# Patient Record
Sex: Male | Born: 1935 | Race: White | Hispanic: No | Marital: Married | Smoking: Current some day smoker
Health system: Southern US, Community
[De-identification: ages and names within clinical notes are randomized; demographics above are authoritative.]

## PROBLEM LIST (undated history)

## (undated) DIAGNOSIS — Z860100 Personal history of colon polyps, unspecified: Secondary | ICD-10-CM

## (undated) DIAGNOSIS — IMO0001 Reserved for inherently not codable concepts without codable children: Secondary | ICD-10-CM

## (undated) DIAGNOSIS — M109 Gout, unspecified: Secondary | ICD-10-CM

## (undated) DIAGNOSIS — R079 Chest pain, unspecified: Secondary | ICD-10-CM

## (undated) DIAGNOSIS — R413 Other amnesia: Secondary | ICD-10-CM

## (undated) DIAGNOSIS — I951 Orthostatic hypotension: Secondary | ICD-10-CM

## (undated) DIAGNOSIS — N400 Enlarged prostate without lower urinary tract symptoms: Secondary | ICD-10-CM

## (undated) DIAGNOSIS — E785 Hyperlipidemia, unspecified: Secondary | ICD-10-CM

## (undated) DIAGNOSIS — G4730 Sleep apnea, unspecified: Secondary | ICD-10-CM

## (undated) DIAGNOSIS — R42 Dizziness and giddiness: Secondary | ICD-10-CM

## (undated) DIAGNOSIS — K635 Polyp of colon: Secondary | ICD-10-CM

## (undated) DIAGNOSIS — E7800 Pure hypercholesterolemia, unspecified: Secondary | ICD-10-CM

## (undated) DIAGNOSIS — R972 Elevated prostate specific antigen [PSA]: Secondary | ICD-10-CM

## (undated) HISTORY — DX: Gout, unspecified: M10.9

## (undated) HISTORY — PX: CARDIAC CATHETERIZATION: SHX172

## (undated) HISTORY — DX: Reserved for inherently not codable concepts without codable children: IMO0001

## (undated) HISTORY — DX: Polyp of colon: K63.5

## (undated) HISTORY — DX: Personal history of colonic polyps: Z86.010

## (undated) HISTORY — DX: Pure hypercholesterolemia, unspecified: E78.00

## (undated) HISTORY — PX: CATARACT EXTRACTION, BILATERAL: SHX1313

## (undated) HISTORY — DX: Personal history of colon polyps, unspecified: Z86.0100

## (undated) HISTORY — PX: COLONOSCOPY: SHX174

## (undated) HISTORY — DX: Dizziness and giddiness: R42

## (undated) HISTORY — DX: Hyperlipidemia, unspecified: E78.5

## (undated) HISTORY — DX: Elevated prostate specific antigen (PSA): R97.20

## (undated) HISTORY — DX: Benign prostatic hyperplasia without lower urinary tract symptoms: N40.0

## (undated) HISTORY — DX: Chest pain, unspecified: R07.9

## (undated) HISTORY — PX: TONSILLECTOMY: SHX5217

---

## 1999-07-19 ENCOUNTER — Ambulatory Visit (HOSPITAL_COMMUNITY): Admission: RE | Admit: 1999-07-19 | Discharge: 1999-07-19 | Payer: Self-pay | Admitting: Family Medicine

## 1999-07-19 ENCOUNTER — Encounter: Payer: Self-pay | Admitting: Family Medicine

## 1999-08-21 ENCOUNTER — Ambulatory Visit (HOSPITAL_COMMUNITY): Admission: RE | Admit: 1999-08-21 | Discharge: 1999-08-21 | Payer: Self-pay | Admitting: Cardiology

## 1999-11-12 ENCOUNTER — Ambulatory Visit (HOSPITAL_BASED_OUTPATIENT_CLINIC_OR_DEPARTMENT_OTHER): Admission: RE | Admit: 1999-11-12 | Discharge: 1999-11-12 | Payer: Self-pay | Admitting: *Deleted

## 2009-03-30 ENCOUNTER — Observation Stay (HOSPITAL_COMMUNITY): Admission: EM | Admit: 2009-03-30 | Discharge: 2009-03-31 | Payer: Self-pay | Admitting: Emergency Medicine

## 2010-07-23 LAB — COMPREHENSIVE METABOLIC PANEL
ALT: 17 U/L (ref 0–53)
AST: 22 U/L (ref 0–37)
Albumin: 4.2 g/dL (ref 3.5–5.2)
Alkaline Phosphatase: 51 U/L (ref 39–117)
BUN: 16 mg/dL (ref 6–23)
CO2: 28 mEq/L (ref 19–32)
Calcium: 9.5 mg/dL (ref 8.4–10.5)
Chloride: 103 mEq/L (ref 96–112)
Creatinine, Ser: 0.89 mg/dL (ref 0.4–1.5)
GFR calc Af Amer: >60 mL/min (ref >60)
GFR calc non Af Amer: >60 mL/min (ref >60)
Glucose, Bld: 90 mg/dL (ref 70–99)
Potassium: 4.3 mEq/L (ref 3.5–5.1)
Sodium: 137 mEq/L (ref 135–145)
Total Bilirubin: 0.8 mg/dL (ref 0.3–1.2)
Total Protein: 7.1 g/dL (ref 6.0–8.3)

## 2010-07-23 LAB — CARDIAC PANEL(CRET KIN+CKTOT+MB+TROPI)
Relative Index: INVALID (ref 0.0–2.5)
Total CK: 65 U/L (ref 7–232)
Troponin I: <0.01 ng/mL (ref 0.00–0.06)

## 2010-07-23 LAB — POCT CARDIAC MARKERS
CKMB, poc: 1.7 ng/mL (ref 1.0–8.0)
CKMB, poc: 2.6 ng/mL (ref 1.0–8.0)
Myoglobin, poc: 87.9 ng/mL (ref 12–200)
Troponin i, poc: <0.05 ng/mL (ref 0.00–0.09)

## 2010-07-23 LAB — DIFFERENTIAL
Basophils Relative: 0 % (ref 0–1)
Lymphocytes Relative: 26 % (ref 12–46)
Monocytes Absolute: 0.6 10*3/uL (ref 0.1–1.0)
Monocytes Relative: 8 % (ref 3–12)
Neutro Abs: 5.2 10*3/uL (ref 1.7–7.7)

## 2010-07-23 LAB — LIPID PANEL
HDL: 70 mg/dL (ref >39)
Triglycerides: 56 mg/dL (ref <150)
VLDL: 11 mg/dL (ref 0–40)

## 2010-07-23 LAB — CBC
HCT: 47.7 % (ref 39.0–52.0)
Hemoglobin: 16.1 g/dL (ref 13.0–17.0)
MCHC: 33.8 g/dL (ref 30.0–36.0)
MCV: 94.3 fL (ref 78.0–100.0)
Platelets: 209 10*3/uL (ref 150–400)
RBC: 5.05 MIL/uL (ref 4.22–5.81)
RDW: 13.8 % (ref 11.5–15.5)
WBC: 8.1 10*3/uL (ref 4.0–10.5)

## 2010-07-23 LAB — POCT I-STAT, CHEM 8
BUN: 19 mg/dL (ref 6–23)
Chloride: 105 mEq/L (ref 96–112)
Creatinine, Ser: 0.8 mg/dL (ref 0.4–1.5)
Glucose, Bld: 92 mg/dL (ref 70–99)
Potassium: 4.5 mEq/L (ref 3.5–5.1)

## 2010-07-23 LAB — TROPONIN I: Troponin I: 0.02 ng/mL (ref 0.00–0.06)

## 2010-09-06 NOTE — Cardiovascular Report (Signed)
[place]. [facility]  Patient:    Ronald Blevins, Ronald Blevins                       MRN: [id number] Proc. Date: 08/21/99 Adm. Date:  [id number] Disc. Date: [id number] Attending:  Corliss Marcus CC:         Lum Babe, M.D.             Cardiac Catheterization Laboratory                        Cardiac Catheterization  CINE NUMBER:  [id number]  PROCEDURES PERFORMED: 1. Left heart catheterization. 2. Coronary angiography. 3. Left ventriculogram. 4. Percutaneous closure, right femoral artery.  INDICATIONS:  Ronald Blevins is a 77 year old man, who has experienced chest discomfort and lightheadedness with syncope.  A myocardial perfusion study suggested infralateral ischemia.  This study is undertaken to evaluate for possible CAD as an etiology.  DESCRIPTION OF PROCEDURE:  The patient was brought to the cardiac catheterization laboratory in the postabsorptive state.  The right groin was prepped and draped in the usual sterile fashion.  Local anesthesia was obtained with the infiltration of 1% lidocaine.  A 5 French catheter sheath was inserted percutaneously into the right femoral artery utilizing an anterior approach over a guiding J wire.  A 5 French 110 cm pigtail catheter was advanced to the ascending aorta where the pressure was recorded.  The catheter was then prolapsed across the aortic valve and the pressure again recorded both prior to and following the ventriculogram.  A 30 degree RAO cine left ventriculogram was performed utilizing a power injector.  Then, 42 cc of Omnipaque were injected at 13 cc/sec.  The pigtail catheter was then exchanged for a 5 French #4 left Judkins catheter.  Cineangiography of the left coronary artery was conducted in multiple LAO and RAO projections.  A 5 French #4 left Judkins catheter for cineangiography.  The right coronary artery was conducted in multiple LAO and RAO projections.  The catheter was then removed.  A digital  cineangiography of the right femoral artery was performed utilizing hand injection in a 45 degree RAO angulation. It documented the puncture site in the right femoral artery to be at or just above the bifurcation into the profunda femoral and superficial femoral arteries.  We then proceeded with percutaneous closure using the Perclose system.  Good hemostasis was achieved as well as intact distal pulses.  FLUOROSCOPY TIME:  4.1 minutes.  TOTAL CONTRAST UTILIZED:  Omnipaque 120 cc.  HEMODYNAMICS:  Systemic arterial pressure was 132/80 with a mean of 105 mmHg. There was no systolic gradient across the aortic valve.  Left ventricular end-diastolic pressure was 15 mmHg pre and post ventriculogram.  ANGIOGRAPHY:  LEFT VENTRICULOGRAM:  The left ventriculogram demonstrated normal chamber size and normal global systolic function.  The calculated ejection fraction utilizing a single plane cine method was 69%.  There was no mitral regurgitation.  There was no coronary calcification seen.  There was a right dominant coronary system present.  The main left coronary artery was normal.  The left anterior descending artery and its branches were essentially normal. There were some luminal irregularities throughout the proximal and midportion of the LAD.  No significant obstructions were seen.  Two small to moderate sized diagonal branches arose without significant obstruction.  The left circumflex artery and its branches were normal.  There were three marginal branches which arose, none of which had  any significant obstructions. There were no stenoses within the main trunk or left circumflex either.  The right coronary artery and its branches were normal.  This is a relatively small vessel that did give rise to a small posterior descending and posterolateral branch and segment.  There were no significant obstructions. There were luminal irregularities in the proximal and midportion.  There  were no collateral vessels seen.  FINAL IMPRESSION: 1. Normal left ventricular size and systolic function. 2. No significant coronary artery disease seen.  PLAN:  The patient will be referred to an electrophysiologist for further evaluation of his syncopal episode. DD:  08/21/99 TD:  08/22/99 Job: [id number]

## 2014-12-30 ENCOUNTER — Emergency Department (HOSPITAL_COMMUNITY): Payer: Medicare Other

## 2014-12-30 ENCOUNTER — Encounter (HOSPITAL_COMMUNITY): Payer: Self-pay | Admitting: *Deleted

## 2014-12-30 ENCOUNTER — Emergency Department (HOSPITAL_COMMUNITY)
Admission: EM | Admit: 2014-12-30 | Discharge: 2014-12-31 | Disposition: A | Discharge status: Home / Self Care | Payer: Medicare Other | Attending: Emergency Medicine | Admitting: Emergency Medicine

## 2014-12-30 DIAGNOSIS — R42 Dizziness and giddiness: Secondary | ICD-10-CM | POA: Diagnosis not present

## 2014-12-30 LAB — COMPREHENSIVE METABOLIC PANEL
ALBUMIN: 3.8 g/dL (ref 3.5–5.0)
ALK PHOS: 43 U/L (ref 38–126)
ALT: 7 U/L — ABNORMAL LOW (ref 17–63)
AST: 16 U/L (ref 15–41)
Anion gap: 12 (ref 5–15)
BILIRUBIN TOTAL: 0.7 mg/dL (ref 0.3–1.2)
BUN: 23 mg/dL — AB (ref 6–20)
CALCIUM: 8.8 mg/dL — AB (ref 8.9–10.3)
CO2: 23 mmol/L (ref 22–32)
Chloride: 102 mmol/L (ref 101–111)
Creatinine, Ser: 1.03 mg/dL (ref 0.61–1.24)
GFR calc Af Amer: >60 mL/min (ref >60)
GLUCOSE: 111 mg/dL — AB (ref 65–99)
POTASSIUM: 3.6 mmol/L (ref 3.5–5.1)
Sodium: 137 mmol/L (ref 135–145)
TOTAL PROTEIN: 6 g/dL — AB (ref 6.5–8.1)

## 2014-12-30 LAB — I-STAT TROPONIN, ED: TROPONIN I, POC: 0 ng/mL (ref 0.00–0.08)

## 2014-12-30 LAB — DIFFERENTIAL
Basophils Absolute: 0 10*3/uL (ref 0.0–0.1)
Basophils Relative: 0 % (ref 0–1)
EOS ABS: 0.3 10*3/uL (ref 0.0–0.7)
EOS PCT: 3 % (ref 0–5)
LYMPHS ABS: 2.1 10*3/uL (ref 0.7–4.0)
LYMPHS PCT: 25 % (ref 12–46)
MONOS PCT: 7 % (ref 3–12)
Monocytes Absolute: 0.6 10*3/uL (ref 0.1–1.0)
Neutro Abs: 5.3 10*3/uL (ref 1.7–7.7)
Neutrophils Relative %: 65 % (ref 43–77)

## 2014-12-30 LAB — PROTIME-INR
INR: 1.08 (ref 0.00–1.49)
Prothrombin Time: 14.2 seconds (ref 11.6–15.2)

## 2014-12-30 LAB — CBC
HEMATOCRIT: 42.1 % (ref 39.0–52.0)
HEMOGLOBIN: 14.1 g/dL (ref 13.0–17.0)
MCH: 31.1 pg (ref 26.0–34.0)
MCHC: 33.5 g/dL (ref 30.0–36.0)
MCV: 92.9 fL (ref 78.0–100.0)
Platelets: 208 10*3/uL (ref 150–400)
RBC: 4.53 MIL/uL (ref 4.22–5.81)
RDW: 13.2 % (ref 11.5–15.5)
WBC: 8.2 10*3/uL (ref 4.0–10.5)

## 2014-12-30 LAB — APTT: aPTT: 26 seconds (ref 24–37)

## 2014-12-30 NOTE — ED Notes (Signed)
Pt reports approx one hour ago onset of right side facial pain, felt lightheaded, nauseated, diaphoretic and difficulty walking due to unsteady gait. Reports it lasted approx 15 mins and then improved. Pt ambulatory on arrival to ed. No facial droop noted, grips are equal, speech is clear.

## 2014-12-30 NOTE — ED Provider Notes (Signed)
CSN: [id number]     Arrival date & time 12/30/14  1741 History   First MD Initiated Contact with Patient 12/30/14 2232     Chief Complaint  Patient presents with  . Dizziness     Patient is a 81 y.o. male presenting with dizziness. The history is provided by the patient. No language interpreter was used.  Dizziness  Ronald Blevins presents for evaluation of altered facial sensation. At 5 PM this evening he had an abnormal  sensation across the right side of his face that lasted for about 15 minutes. He had associated discomfort that radiated up the right side of his face. He felt dizzy and diaphoretic during this event. He felt generalized weakness. He currently has no complaints and his symptoms have completely resolved. He has no medical problems and is followed by Dr. Harrington Challenger.  History reviewed. No pertinent past medical history. History reviewed. No pertinent past surgical history. History reviewed. No pertinent family history. Social History  Substance Use Topics  . Smoking status: Never Smoker   . Smokeless tobacco: None  . Alcohol Use: Yes     Comment: occ    Review of Systems  Neurological: Positive for dizziness.  All other systems reviewed and are negative.     Allergies  Review of patient's allergies indicates no known allergies.  Home Medications   Prior to Admission medications   Not on File   BP 134/72 mmHg  Pulse 80  Temp(Src) 98.4 F (36.9 C) (Oral)  Resp 20  Ht 5\' 11"  (1.803 m)  Wt 182 lb (82.555 kg)  BMI 25.40 kg/m2  SpO2 98% Physical Exam  Constitutional: He is oriented to person, place, and time. He appears well-developed and well-nourished.  HENT:  Head: Normocephalic and atraumatic.  Cardiovascular: Normal rate and regular rhythm.   No murmur heard. Pulmonary/Chest: Effort normal and breath sounds normal. No respiratory distress.  Abdominal: Soft. There is no tenderness. There is no rebound and no guarding.  Musculoskeletal: He exhibits no edema  or tenderness.  Neurological: He is alert and oriented to person, place, and time. No cranial nerve deficit.  5/5 strength in all four extremities, sensation to light touch intact in all four extremities.  No ataxia.  Skin: Skin is warm and dry.  Psychiatric: He has a normal mood and affect. His behavior is normal.  Nursing note and vitals reviewed.   ED Course  Procedures (including critical care time) Labs Review Labs Reviewed  COMPREHENSIVE METABOLIC PANEL - Abnormal; Notable for the following:    Glucose, Bld 111 (*)    BUN 23 (*)    Calcium 8.8 (*)    Total Protein 6.0 (*)    ALT 7 (*)    All other components within normal limits  PROTIME-INR  APTT  CBC  DIFFERENTIAL  I-STAT TROPOININ, ED    Imaging Review Ct Head Wo Contrast  12/31/2014   CLINICAL DATA:  Dizziness and altered mental status which is now resolved.  EXAM: CT HEAD WITHOUT CONTRAST  TECHNIQUE: Contiguous axial images were obtained from the base of the skull through the vertex without intravenous contrast.  COMPARISON:  None.  FINDINGS: Diffuse cerebral atrophy. Low-attenuation changes in the deep white matter consistent small vessel ischemia. Large CSF space in the right middle cranial fossa probably representing arachnoid cyst. No mass effect or midline shift. Gray-white matter junctions are distinct. Basal cisterns are not effaced. No acute intracranial hemorrhage. Visualized paranasal sinuses and mastoid air cells are not opacified. No  depressed skull fractures.  IMPRESSION: CSF space in the right middle cranial fossa probably representing an arachnoid cyst. No acute intracranial abnormalities. Chronic atrophy and small vessel ischemic changes.   Electronically Signed   By: Lucienne Capers M.D.   On: 12/31/2014 00:28   I have personally reviewed and evaluated these images and lab results as part of my medical decision-making.   EKG Interpretation   Date/Time:  Saturday December 30 2014 18:15:04  EDT Ventricular Rate:  79 PR Interval:  168 QRS Duration: 96 QT Interval:  394 QTC Calculation: 451 R Axis:   -37 Text Interpretation:  Normal sinus rhythm Left axis deviation Minimal  voltage criteria for LVH, may be normal variant Abnormal ECG Confirmed by  Hazle Coca [phone]) on 12/30/2014 10:33:05 PM      MDM   Final diagnoses:  Spell of dizziness    Patient here for evaluation of facial altered sensation, and dizziness prior to ED arrival. Symptoms have completely resolved in the emergency department. He has no focal deficits. History of presentation is not consistent with ACS, dissection, CVA. Discussed with patient possibility of TIA and workup options which include expedited outpatient workup versus admission for workup in the hospital. Patient prefers outpatient follow-up with his primary care provider. Discussed starting baby aspirin daily for stroke prevention. Also discussed close return precautions for recurrent symptoms.    Quintella Reichert, MD 12/31/14 [phone]

## 2014-12-30 NOTE — ED Notes (Signed)
Radiology technician reported that pt. refused CT scan .

## 2014-12-31 MED ORDER — ASPIRIN 81 MG PO CHEW: 81.0000 mg | CHEWABLE_TABLET | Freq: Every day | ORAL | Status: DC

## 2014-12-31 NOTE — Discharge Instructions (Signed)
Transient Ischemic Attack  A transient ischemic attack (TIA) is a "warning stroke" that causes stroke-like symptoms. Unlike a stroke, a TIA does not cause permanent damage to the brain. The symptoms of a TIA can happen very fast and do not last long. It is important to know the symptoms of a TIA and what to do. This can help prevent a major stroke or death.  CAUSES   · A TIA is caused by a temporary blockage in an artery in the brain or neck (carotid artery). The blockage does not allow the brain to get the blood supply it needs and can cause different symptoms. The blockage can be caused by either:  ¨ A blood clot.  ¨ Fatty buildup (plaque) in a neck or brain artery.  RISK FACTORS  · High blood pressure (hypertension).  · High cholesterol.  · Diabetes mellitus.  · Heart disease.  · The build up of plaque in the blood vessels (peripheral artery disease or atherosclerosis).  · The build up of plaque in the blood vessels providing blood and oxygen to the brain (carotid artery stenosis).  · An abnormal heart rhythm (atrial fibrillation).  · Obesity.  · Smoking.  · Taking oral contraceptives (especially in combination with smoking).  · Physical inactivity.  · A diet high in fats, salt (sodium), and calories.  · Alcohol use.  · Use of illegal drugs (especially cocaine and methamphetamine).  · Being male.  · Being African American.  · Being over the age of 55.  · Family history of stroke.  · Previous history of blood clots, stroke, TIA, or heart attack.  · Sickle cell disease.  SYMPTOMS   TIA symptoms are the same as a stroke but are temporary. These symptoms usually develop suddenly, or may be newly present upon awakening from sleep:  · Sudden weakness or numbness of the face, arm, or leg, especially on one side of the body.  · Sudden trouble walking or difficulty moving arms or legs.  · Sudden confusion.  · Sudden personality changes.  · Trouble speaking (aphasia) or understanding.  · Difficulty swallowing.  · Sudden  trouble seeing in one or both eyes.  · Double vision.  · Dizziness.  · Loss of balance or coordination.  · Sudden severe headache with no known cause.  · Trouble reading or writing.  · Loss of bowel or bladder control.  · Loss of consciousness.  DIAGNOSIS   Your caregiver may be able to determine the presence or absence of a TIA based on your symptoms, history, and physical exam. Computed tomography (CT scan) of the brain is usually performed to help identify a TIA. Other tests may be done to diagnose a TIA. These tests may include:  · Electrocardiography.  · Continuous heart monitoring.  · Echocardiography.  · Carotid ultrasonography.  · Magnetic resonance imaging (MRI).  · A scan of the brain circulation.  · Blood tests.  PREVENTION   The risk of a TIA can be decreased by appropriately treating high blood pressure, high cholesterol, diabetes, heart disease, and obesity and by quitting smoking, limiting alcohol, and staying physically active.  TREATMENT   Time is of the essence. Since the symptoms of TIA are the same as a stroke, it is important to seek treatment as soon as possible because you may need a medicine to dissolve the clot (thrombolytic) that cannot be given if too much time has passed. Treatment options vary. Treatment options may include rest, oxygen, intravenous (IV) fluids,   and medicines to thin the blood (anticoagulants). Medicines and diet may be used to address diabetes, high blood pressure, and other risk factors. Measures will be taken to prevent short-term and long-term complications, including infection from breathing foreign material into the lungs (aspiration pneumonia), blood clots in the legs, and falls. Treatment options include procedures to either remove plaque in the carotid arteries or dilate carotid arteries that have narrowed due to plaque. Those procedures are:  · Carotid endarterectomy.  · Carotid angioplasty and stenting.  HOME CARE INSTRUCTIONS   · Take all medicines prescribed  by your caregiver. Follow the directions carefully. Medicines may be used to control risk factors for a stroke. Be sure you understand all your medicine instructions.  · You may be told to take aspirin or the anticoagulant warfarin. Warfarin needs to be taken exactly as instructed.  ¨ Taking too much or too little warfarin is dangerous. Too much warfarin increases the risk of bleeding. Too little warfarin continues to allow the risk for blood clots. While taking warfarin, you will need to have regular blood tests to measure your blood clotting time. A PT blood test measures how long it takes for blood to clot. Your PT is used to calculate another value called an INR. Your PT and INR help your caregiver to adjust your dose of warfarin. The dose can change for many reasons. It is critically important that you take warfarin exactly as prescribed.  ¨ Many foods, especially foods high in vitamin K can interfere with warfarin and affect the PT and INR. Foods high in vitamin K include spinach, kale, broccoli, cabbage, collard and turnip greens, brussels sprouts, peas, cauliflower, seaweed, and parsley as well as beef and pork liver, green tea, and soybean oil. You should eat a consistent amount of foods high in vitamin K. Avoid major changes in your diet, or notify your caregiver before changing your diet. Arrange a visit with a dietitian to answer your questions.  ¨ Many medicines can interfere with warfarin and affect the PT and INR. You must tell your caregiver about any and all medicines you take, this includes all vitamins and supplements. Be especially cautious with aspirin and anti-inflammatory medicines. Do not take or discontinue any prescribed or over-the-counter medicine except on the advice of your caregiver or pharmacist.  ¨ Warfarin can have side effects, such as excessive bruising or bleeding. You will need to hold pressure over cuts for longer than usual. Your caregiver or pharmacist will discuss other  potential side effects.  ¨ Avoid sports or activities that may cause injury or bleeding.  ¨ Be mindful when shaving, flossing your teeth, or handling sharp objects.  ¨ Alcohol can change the body's ability to handle warfarin. It is best to avoid alcoholic drinks or consume only very small amounts while taking warfarin. Notify your caregiver if you change your alcohol intake.  ¨ Notify your dentist or other caregivers before procedures.  · Eat a diet that includes 5 or more servings of fruits and vegetables each day. This may reduce the risk of stroke. Certain diets may be prescribed to address high blood pressure, high cholesterol, diabetes, or obesity.  ¨ A low-sodium, low-saturated fat, low-trans fat, low-cholesterol diet is recommended to manage high blood pressure.  ¨ A low-saturated fat, low-trans fat, low-cholesterol, and high-fiber diet may control cholesterol levels.  ¨ A controlled-carbohydrate, controlled-sugar diet is recommended to manage diabetes.  ¨ A reduced-calorie, low-sodium, low-saturated fat, low-trans fat, low-cholesterol diet is recommended to manage obesity.  ·   Maintain a healthy weight.  · Stay physically active. It is recommended that you get at least 30 minutes of activity on most or all days.  · Do not smoke.  · Limit alcohol use even if you are not taking warfarin. Moderate alcohol use is considered to be:  ¨ No more than 2 drinks each day for men.  ¨ No more than 1 drink each day for nonpregnant women.  · Stop drug abuse.  · Home safety. A safe home environment is important to reduce the risk of falls. Your caregiver may arrange for specialists to evaluate your home. Having grab bars in the bedroom and bathroom is often important. Your caregiver may arrange for equipment to be used at home, such as raised toilets and a seat for the shower.  · Follow all instructions for follow-up with your caregiver. This is very important. This includes any referrals and lab tests. Proper follow up can  prevent a stroke or another TIA from occurring.  SEEK MEDICAL CARE IF:  · You have personality changes.  · You have difficulty swallowing.  · You are seeing double.  · You have dizziness.  · You have a fever.  · You have skin breakdown.  SEEK IMMEDIATE MEDICAL CARE IF:   Any of these symptoms may represent a serious problem that is an emergency. Do not wait to see if the symptoms will go away. Get medical help right away. Call your local emergency services (911 in [place] Do not drive yourself to the hospital.  · You have sudden weakness or numbness of the face, arm, or leg, especially on one side of the body.  · You have sudden trouble walking or difficulty moving arms or legs.  · You have sudden confusion.  · You have trouble speaking (aphasia) or understanding.  · You have sudden trouble seeing in one or both eyes.  · You have a loss of balance or coordination.  · You have a sudden, severe headache with no known cause.  · You have new chest pain or an irregular heartbeat.  · You have a partial or total loss of consciousness.  MAKE SURE YOU:   · Understand these instructions.  · Will watch your condition.  · Will get help right away if you are not doing well or get worse.  Document Released: 01/15/2005 Document Revised: 04/12/2013 Document Reviewed: 07/13/2013  Exit[employer]® Patient Information ©2015 [employer]. This information is not intended to replace advice given to you by your health care provider. Make sure you discuss any questions you have with your health care provider.

## 2015-01-08 ENCOUNTER — Ambulatory Visit (INDEPENDENT_AMBULATORY_CARE_PROVIDER_SITE_OTHER): Payer: Medicare Other | Admitting: Cardiology

## 2015-01-08 ENCOUNTER — Encounter: Payer: Self-pay | Admitting: Cardiology

## 2015-01-08 VITALS — BP 158/90 | HR 80 | Ht 71.5 in | Wt 187.4 lb

## 2015-01-08 DIAGNOSIS — Z86010 Personal history of colonic polyps: Secondary | ICD-10-CM | POA: Insufficient documentation

## 2015-01-08 DIAGNOSIS — R972 Elevated prostate specific antigen [PSA]: Secondary | ICD-10-CM | POA: Insufficient documentation

## 2015-01-08 DIAGNOSIS — R42 Dizziness and giddiness: Secondary | ICD-10-CM | POA: Diagnosis not present

## 2015-01-08 DIAGNOSIS — G459 Transient cerebral ischemic attack, unspecified: Secondary | ICD-10-CM | POA: Diagnosis not present

## 2015-01-08 DIAGNOSIS — E785 Hyperlipidemia, unspecified: Secondary | ICD-10-CM | POA: Insufficient documentation

## 2015-01-08 DIAGNOSIS — I951 Orthostatic hypotension: Secondary | ICD-10-CM

## 2015-01-08 DIAGNOSIS — K635 Polyp of colon: Secondary | ICD-10-CM | POA: Insufficient documentation

## 2015-01-08 DIAGNOSIS — R079 Chest pain, unspecified: Secondary | ICD-10-CM | POA: Insufficient documentation

## 2015-01-08 DIAGNOSIS — E7800 Pure hypercholesterolemia, unspecified: Secondary | ICD-10-CM | POA: Insufficient documentation

## 2015-01-08 NOTE — Addendum Note (Signed)
Addended by: Audria Nine on: 01/08/2015 08:39 AM   Modules accepted: Medications

## 2015-01-08 NOTE — Progress Notes (Addendum)
Cardiology Office Note   Date:  01/08/2015   ID:  Ronald Blevins, DOB Aug 15, 1932, MRN [id number]  PCP:   Melinda Crutch, MD    Chief Complaint  Patient presents with  . Dizziness      History of Present Illness: Ronald Blevins is a 81 y.o. male who presents for evaluation of dizziness.  He was seen in the ER a few weeks ago with complaints of dizziness.  He presented with altered facial sensation across the right side of his face lasting about 15 minutes with discomfort that radiated up the right side of his face.  He became diaphoretic during the event and weak.  EKG showed HSR with LVH and LAD.  He had no focal deficits and was sent home.  He was seen back by Dr. Harrington Challenger.  He is being sent to Cardiology to evaluate for a cardiac etiology of possible TIA.      Past Medical History  Diagnosis Date  . Hypercholesterolemia   . Colon polyp   . PSA elevation   . Dizziness   . History of colon polyps   . Chest pain   . Dyslipidemia     Past Surgical History  Procedure Laterality Date  . Tonsillectomy    . Cataract extraction, bilateral Bilateral   . Colonoscopy    . Cardiac catheterization       Current Outpatient Prescriptions  Medication Sig Dispense Refill  . Ascorbic Acid (VITAMIN C PO) Take 1 tablet by mouth daily.    Marland Kitchen aspirin 81 MG chewable tablet Chew 1 tablet (81 mg total) by mouth daily. 30 tablet 0  . B Complex Vitamins (VITAMIN B COMPLEX) TABS Take 1 tablet by mouth daily.    . Misc Natural Products (PROSTATE HEALTH PO) Take 1 tablet by mouth daily.    . multivitamin-lutein (OCUVITE-LUTEIN) CAPS capsule Take 1 capsule by mouth daily.    . Omega-3 Fatty Acids (FISH OIL) 1000 MG CAPS Take 1,000 mg by mouth daily.     No current facility-administered medications for this visit.    Allergies:   Review of patient's allergies indicates no known allergies.    Social History:  The patient  reports that he has never smoked. He does not have any  smokeless tobacco history on file. He reports that he drinks alcohol. He reports that he does not use illicit drugs.   Family History:  The patient's family history includes Alzheimer's disease in his mother; Cancer in his sister; Cancer - Colon in his sister; Dementia in his father.    ROS:  Please see the history of present illness.   Otherwise, review of systems are positive for none.   All other systems are reviewed and negative.    PHYSICAL EXAM: VS:  BP 158/90 mmHg  Pulse 80  Ht 5' 11.5" (1.816 m)  Wt 187 lb 6.4 oz (85.004 kg)  BMI 25.78 kg/m2 , BMI Body mass index is 25.78 kg/(m^2). GEN: Well nourished, well developed, in no acute distress HEENT: normal Neck: no JVD, carotid bruits, or masses Cardiac: RRR; no murmurs, rubs, or gallops,no edema  Respiratory:  clear to auscultation bilaterally, normal work of breathing GI: soft, nontender, nondistended, + BS MS: no deformity or atrophy Skin: warm and dry, no rash Neuro:  Strength and sensation are intact Psych: euthymic mood, full affect   EKG:  EKG is not ordered today.  Recent Labs: 12/30/2014: ALT 7*; BUN 23*; Creatinine, Ser 1.03; Hemoglobin 14.1; Platelets 208; Potassium 3.6; Sodium 137    Lipid Panel    Component Value Date/Time   CHOL  03/30/2009 1557    161        ATP III CLASSIFICATION:  <200     mg/dL   Desirable  200-239  mg/dL   Borderline High  >=240    mg/dL   High          TRIG 56 03/30/2009 1557   HDL 70 03/30/2009 1557   CHOLHDL 2.3 03/30/2009 1557   VLDL 11 03/30/2009 1557   LDLCALC  03/30/2009 1557    80        Total Cholesterol/HDL:CHD Risk Coronary Heart Disease Risk Table                     Men   Women  1/2 Average Risk   3.4   3.3  Average Risk       5.0   4.4  2 X Average Risk   9.6   7.1  3 X Average Risk  23.4   11.0        Use the calculated Patient Ratio above and the CHD Risk Table to determine the patient's CHD Risk.        ATP III CLASSIFICATION (LDL):  <100      mg/dL   Optimal  100-129  mg/dL   Near or Above                    Optimal  130-159  mg/dL   Borderline  160-189  mg/dL   High  >190     mg/dL   Very High      Wt Readings from Last 3 Encounters:  01/08/15 187 lb 6.4 oz (85.004 kg)  12/30/14 182 lb (82.555 kg)       ASSESSMENT AND PLAN:  1.  TIA - will check a 2D echo to assess LVF and LA size.  Check 30 day event monitor to assess for silent PAF.  If he has recurrent symptoms may need to consider ILR. 2.  Dizziness - ? Etiology.  Will check 2D echo to assess LVF.     Current medicines are reviewed at length with the patient today.  The patient does not have concerns regarding medicines.  The following changes have been made:  no change  Labs/ tests ordered today: See above Assessment and Plan No orders of the defined types were placed in this encounter.     Disposition:   FU with me in 2 months  Signed, Sueanne Margarita, MD  01/08/2015 3:56 PM    [facility] [place], [place], [place]  [address] Phone: [phone]; Fax: [phone]

## 2015-01-08 NOTE — Patient Instructions (Addendum)
Medication Instructions:  Your physician recommends that you continue on your current medications as directed. Please refer to the Current Medication list given to you today.   Labwork: None  Testing/Procedures: Your physician has requested that you have an echocardiogram. Echocardiography is a painless test that uses sound waves to create images of your heart. It provides your doctor with information about the size and shape of your heart and how well your heart's chambers and valves are working. This procedure takes approximately one hour. There are no restrictions for this procedure.  Your physician has recommended that you wear an event monitor. Event monitors are medical devices that record the heart's electrical activity. Doctors most often [place] these monitors to diagnose arrhythmias. Arrhythmias are problems with the speed or rhythm of the heartbeat. The monitor is a small, portable device. You can wear one while you do your normal daily activities. This is usually used to diagnose what is causing palpitations/syncope (passing out).  Follow-Up: Your physician recommends that you schedule a follow-up appointment in 2 months with Dr. Radford Pax.  Any Other Special Instructions Will Be Listed Below (If Applicable).

## 2015-01-11 ENCOUNTER — Other Ambulatory Visit: Payer: Self-pay | Admitting: Cardiology

## 2015-01-11 DIAGNOSIS — G459 Transient cerebral ischemic attack, unspecified: Secondary | ICD-10-CM

## 2015-01-11 DIAGNOSIS — R42 Dizziness and giddiness: Secondary | ICD-10-CM

## 2015-01-12 ENCOUNTER — Ambulatory Visit (HOSPITAL_COMMUNITY): Payer: Medicare Other | Attending: Cardiology

## 2015-01-12 ENCOUNTER — Other Ambulatory Visit: Payer: Self-pay

## 2015-01-12 ENCOUNTER — Ambulatory Visit (INDEPENDENT_AMBULATORY_CARE_PROVIDER_SITE_OTHER): Payer: Medicare Other

## 2015-01-12 DIAGNOSIS — G459 Transient cerebral ischemic attack, unspecified: Secondary | ICD-10-CM

## 2015-01-12 DIAGNOSIS — I071 Rheumatic tricuspid insufficiency: Secondary | ICD-10-CM | POA: Diagnosis not present

## 2015-01-12 DIAGNOSIS — I5189 Other ill-defined heart diseases: Secondary | ICD-10-CM | POA: Insufficient documentation

## 2015-01-12 DIAGNOSIS — I517 Cardiomegaly: Secondary | ICD-10-CM | POA: Diagnosis not present

## 2015-01-12 DIAGNOSIS — R42 Dizziness and giddiness: Secondary | ICD-10-CM

## 2015-01-12 DIAGNOSIS — I340 Nonrheumatic mitral (valve) insufficiency: Secondary | ICD-10-CM | POA: Diagnosis not present

## 2015-01-12 DIAGNOSIS — I351 Nonrheumatic aortic (valve) insufficiency: Secondary | ICD-10-CM | POA: Insufficient documentation

## 2015-01-12 DIAGNOSIS — E780 Pure hypercholesterolemia: Secondary | ICD-10-CM | POA: Diagnosis not present

## 2015-01-12 DIAGNOSIS — I77810 Thoracic aortic ectasia: Secondary | ICD-10-CM | POA: Diagnosis not present

## 2015-01-15 ENCOUNTER — Telehealth: Payer: Self-pay

## 2015-01-15 DIAGNOSIS — I77810 Thoracic aortic ectasia: Secondary | ICD-10-CM

## 2015-01-15 NOTE — Telephone Encounter (Signed)
Informed patient of results and verbal understanding expressed.   Repeat ECHO ordered to be scheduled in one year. Patient agrees with treatment plan. 

## 2015-01-15 NOTE — Telephone Encounter (Signed)
-----   Message from Sueanne Margarita, MD sent at 01/14/2015  4:54 PM EDT ----- Please let patient know that echo showed mildly thickened heart muscle with normal LVF and mild AR, mildly dilated aorta - repeat echo in 1 year for dilated aortic root

## 2015-02-15 ENCOUNTER — Ambulatory Visit
Admission: RE | Admit: 2015-02-15 | Discharge: 2015-02-15 | Disposition: A | Discharge status: Home / Self Care | Payer: Medicare Other | Source: Ambulatory Visit | Attending: Surgery | Admitting: Surgery

## 2015-02-15 ENCOUNTER — Other Ambulatory Visit: Payer: Self-pay | Admitting: Surgery

## 2015-02-15 DIAGNOSIS — E041 Nontoxic single thyroid nodule: Secondary | ICD-10-CM

## 2015-02-21 ENCOUNTER — Ambulatory Visit
Admission: RE | Admit: 2015-02-21 | Discharge: 2015-02-21 | Disposition: A | Discharge status: Home / Self Care | Payer: Medicare Other | Source: Ambulatory Visit | Attending: Surgery | Admitting: Surgery

## 2015-02-21 ENCOUNTER — Other Ambulatory Visit (HOSPITAL_COMMUNITY)
Admission: RE | Admit: 2015-02-21 | Discharge: 2015-02-21 | Disposition: A | Discharge status: Home / Self Care | Payer: Medicare Other | Source: Ambulatory Visit | Attending: Radiology | Admitting: Radiology

## 2015-02-21 DIAGNOSIS — E041 Nontoxic single thyroid nodule: Secondary | ICD-10-CM | POA: Insufficient documentation

## 2015-03-09 ENCOUNTER — Encounter: Payer: Self-pay | Admitting: Cardiology

## 2015-03-09 ENCOUNTER — Ambulatory Visit (INDEPENDENT_AMBULATORY_CARE_PROVIDER_SITE_OTHER): Payer: Medicare Other | Admitting: Cardiology

## 2015-03-09 VITALS — BP 144/102 | HR 84 | Ht 72.0 in | Wt 191.0 lb

## 2015-03-09 DIAGNOSIS — IMO0001 Reserved for inherently not codable concepts without codable children: Secondary | ICD-10-CM | POA: Insufficient documentation

## 2015-03-09 DIAGNOSIS — I951 Orthostatic hypotension: Secondary | ICD-10-CM | POA: Diagnosis not present

## 2015-03-09 DIAGNOSIS — G459 Transient cerebral ischemic attack, unspecified: Secondary | ICD-10-CM

## 2015-03-09 DIAGNOSIS — R42 Dizziness and giddiness: Secondary | ICD-10-CM

## 2015-03-09 DIAGNOSIS — R030 Elevated blood-pressure reading, without diagnosis of hypertension: Secondary | ICD-10-CM | POA: Diagnosis not present

## 2015-03-09 HISTORY — DX: Reserved for inherently not codable concepts without codable children: IMO0001

## 2015-03-09 NOTE — Progress Notes (Signed)
Cardiology Office Note   Date:  03/09/2015   ID:  Ronald Blevins, DOB 11-Aug-1932, MRN [id number]  PCP:   Melinda Crutch, MD    Chief Complaint  Patient presents with  . Dizziness      History of Present Illness: Ronald Blevins is a 81 y.o. male who presents for followup of dizziness and mildly dilated aortic root with mild AR.  He was seen recently by me for cariac w/u of TIA. 2D echo showed normal LVF with mildly dilated aortic root and mild AR.  He wore a heart monitor which showed no arrhythmias.  He denies any chest pain, SOB, DOE, LE edema.  He has not had any further dizziness or tingling or numbness of his face.     Past Medical History  Diagnosis Date  . Hypercholesterolemia   . Colon polyp   . PSA elevation   . Dizziness   . History of colon polyps   . Chest pain   . Dyslipidemia     Past Surgical History  Procedure Laterality Date  . Tonsillectomy    . Cataract extraction, bilateral Bilateral   . Colonoscopy    . Cardiac catheterization       Current Outpatient Prescriptions  Medication Sig Dispense Refill  . Ascorbic Acid (VITAMIN C PO) Take 1 tablet by mouth daily.    Marland Kitchen aspirin 81 MG chewable tablet Chew 1 tablet (81 mg total) by mouth daily. 30 tablet 0  . B Complex Vitamins (VITAMIN B COMPLEX) TABS Take 1 tablet by mouth daily.    . Misc Natural Products (PROSTATE HEALTH PO) Take 1 tablet by mouth daily.    . multivitamin-lutein (OCUVITE-LUTEIN) CAPS capsule Take 1 capsule by mouth daily.    . Omega-3 Fatty Acids (FISH OIL) 1000 MG CAPS Take 1,000 mg by mouth daily.    . finasteride (PROSCAR) 5 MG tablet Take 1 tablet by mouth daily. Take 1 tab daily    . tamsulosin (FLOMAX) 0.4 MG CAPS capsule Take 1 capsule by mouth daily. Take 1Tab daily     No current facility-administered medications for this visit.    Allergies:   Review of patient's allergies indicates no known allergies.    Social History:  The patient  reports that he has  never smoked. He does not have any smokeless tobacco history on file. He reports that he drinks alcohol. He reports that he does not use illicit drugs.   Family History:  The patient's family history includes Alzheimer's disease in his mother; Cancer in his sister; Cancer - Colon in his sister; Dementia in his father.    ROS:  Please see the history of present illness.   Otherwise, review of systems are positive for none.   All other systems are reviewed and negative.    PHYSICAL EXAM: VS:  BP 144/102 mmHg  Pulse 84  Ht 6' (1.829 m)  Wt 86.637 kg (191 lb)  BMI 25.90 kg/m2 , BMI Body mass index is 25.9 kg/(m^2). GEN: Well nourished, well developed, in no acute distress HEENT: normal Neck: no JVD, carotid bruits, or masses Cardiac: RRR; no murmurs, rubs, or gallops,no edema  Respiratory:  clear to auscultation bilaterally, normal work of breathing GI: soft, nontender, nondistended, + BS MS: no deformity or atrophy Skin: warm and dry, no rash Neuro:  Strength and sensation are intact Psych: euthymic mood, full affect   EKG:  EKG  is not ordered today.   Recent Labs: 12/30/2014: ALT 7*; BUN 23*; Creatinine, Ser 1.03; Hemoglobin 14.1; Platelets 208; Potassium 3.6; Sodium 137    Lipid Panel    Component Value Date/Time   CHOL  03/30/2009 1557    161        ATP III CLASSIFICATION:  <200     mg/dL   Desirable  200-239  mg/dL   Borderline High  >=240    mg/dL   High          TRIG 56 03/30/2009 1557   HDL 70 03/30/2009 1557   CHOLHDL 2.3 03/30/2009 1557   VLDL 11 03/30/2009 1557   LDLCALC  03/30/2009 1557    80        Total Cholesterol/HDL:CHD Risk Coronary Heart Disease Risk Table                     Men   Women  1/2 Average Risk   3.4   3.3  Average Risk       5.0   4.4  2 X Average Risk   9.6   7.1  3 X Average Risk  23.4   11.0        Use the calculated Patient Ratio above and the CHD Risk Table to determine the patient's CHD Risk.        ATP III CLASSIFICATION  (LDL):  <100     mg/dL   Optimal  100-129  mg/dL   Near or Above                    Optimal  130-159  mg/dL   Borderline  160-189  mg/dL   High  >190     mg/dL   Very High      Wt Readings from Last 3 Encounters:  03/09/15 86.637 kg (191 lb)  01/08/15 85.004 kg (187 lb 6.4 oz)  12/30/14 82.555 kg (182 lb)    ASSESSMENT AND PLAN:  1. TIA - 2D echo was normal and  30 day event monitor was normal.It is unclear whether this was a TIA.  I have recommended referral to Neuro and if they feel that there is enough concern that this could have been a TIA then will refer to EP for ILR to assess long term for arrhythmias.  2. Dizziness - ? Etiology. 2D echo was normal and no arrhythmia on 30 day monitor.  He thinks it was related to low BP 3.  Elevated BP - his BP at home this am was 128/1mmHg and he has a history of white coat HTN.      Current medicines are reviewed at length with the patient today.  The patient does not have concerns regarding medicines.  The following changes have been made:  no change  Labs/ tests ordered today: See above Assessment and Plan No orders of the defined types were placed in this encounter.     Disposition:   FU with me PRN pending recommendation from Neuro Signed, Sueanne Margarita, MD  03/09/2015 11:04 AM    [facility] [place], [place], [place]  [address] Phone: [phone]; Fax: [phone]

## 2015-03-09 NOTE — Patient Instructions (Signed)
Medication Instructions:  Your physician recommends that you continue on your current medications as directed. Please refer to the Current Medication list given to you today.   Labwork: None  Testing/Procedures: None  Follow-Up: You have been referred to NEUROLOGY for questionable TIA.  Your physician recommends that you schedule a follow-up appointment AS NEEDED with Dr. Radford Pax.  Any Other Special Instructions Will Be Listed Below (If Applicable).     If you need a refill on your cardiac medications before your next appointment, please call your pharmacy.

## 2015-04-17 ENCOUNTER — Telehealth: Payer: Self-pay

## 2015-04-17 NOTE — Telephone Encounter (Signed)
Armando Gang - Neurology referral >','<< Less Detail',event)" href="javascript:;">More Detail >>   Neurology referral     Armando Gang    Sent: Tue April 17, 2015 11:33 AM    To: Theodoro Parma, RN        Message     03-20-15  Called pt to schedule apt/per pt he does not think he needs this referral and has declined to schedule apt/gna-bws.

## 2015-11-04 IMAGING — US US SOFT TISSUE HEAD/NECK
1 series · 14 of 25 positions shown · non-contrast
Comparison: None.

CLINICAL DATA: Follow-up nodule on carotid ultrasound

EXAM:
THYROID ULTRASOUND
TECHNIQUE: Ultrasound examination of the thyroid gland and adjacent soft
tissues was performed.

[Series 1: us soft tissue head/neck · 0.08mm/px · 14 of 39 slices shown]
[im 1/39]
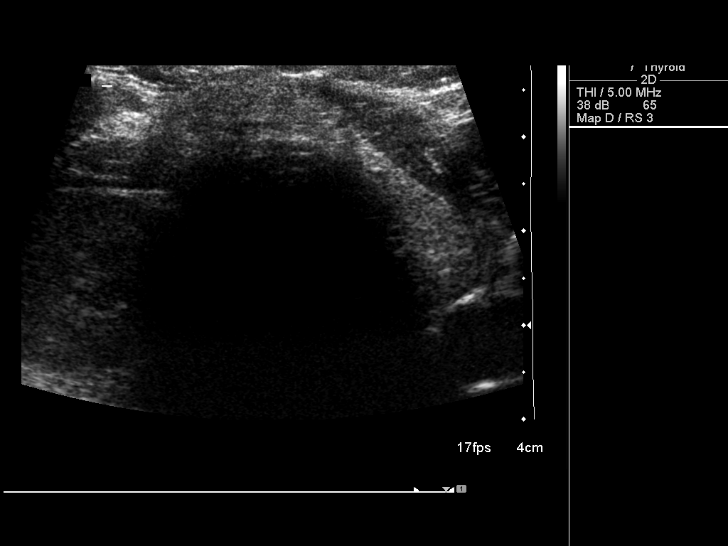
[im 4/39]
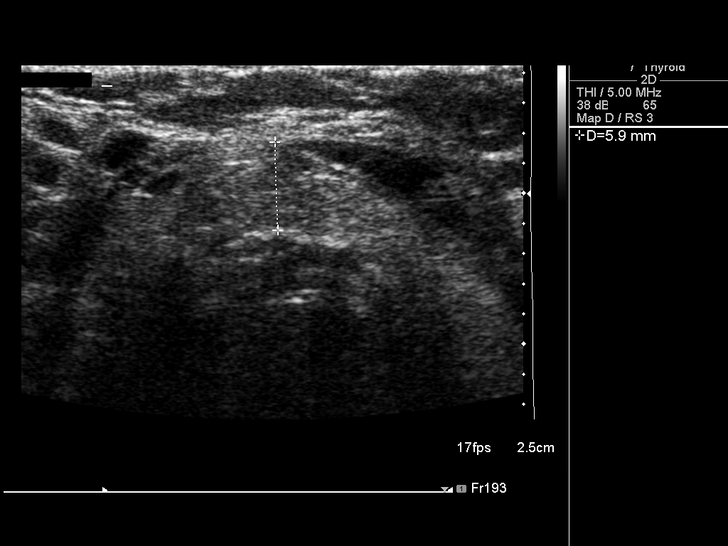
[im 7/39]
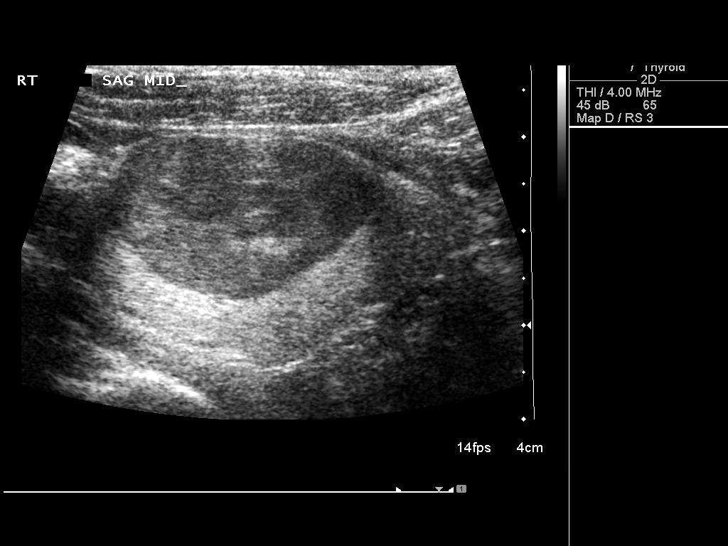
[im 10/39]
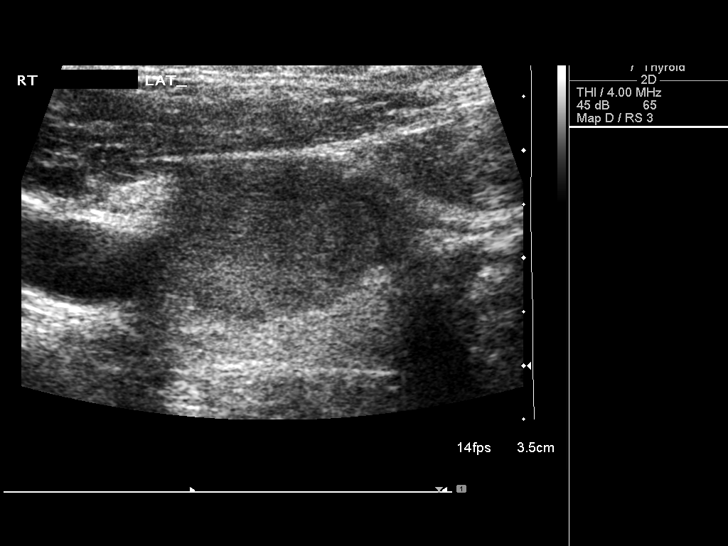
[im 13/39]
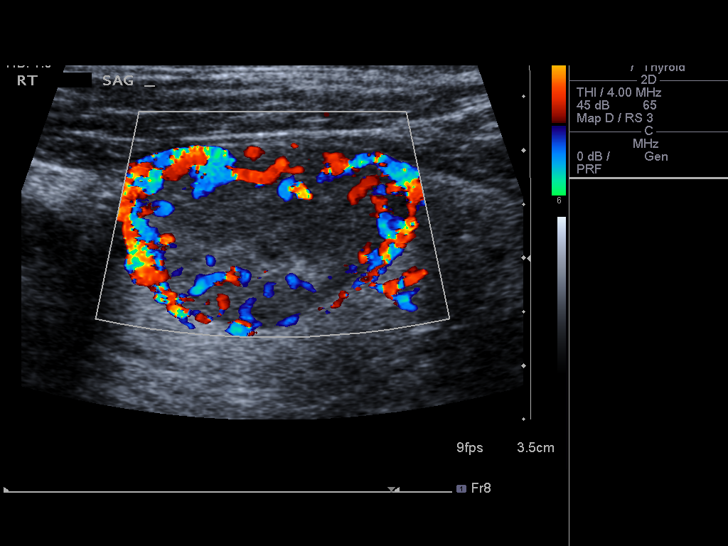
[im 15/39]
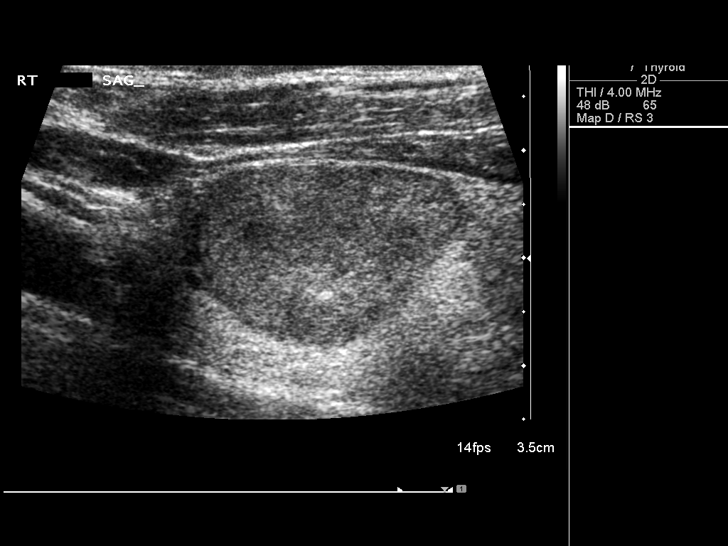
[im 18/39]
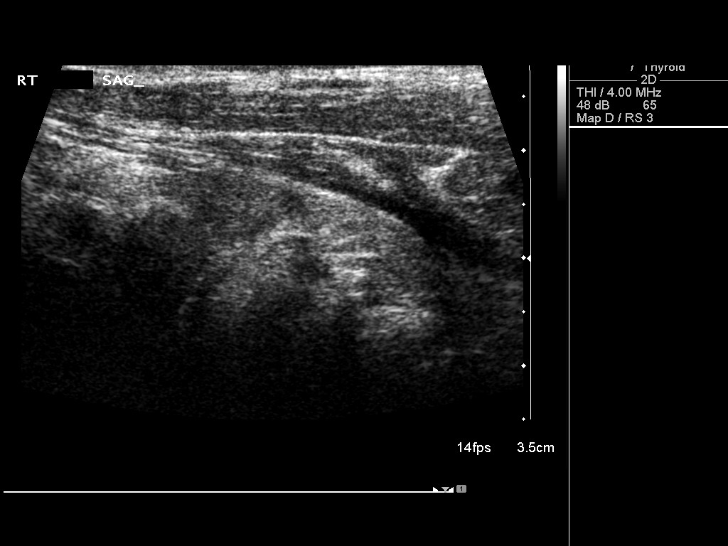
[im 21/39]
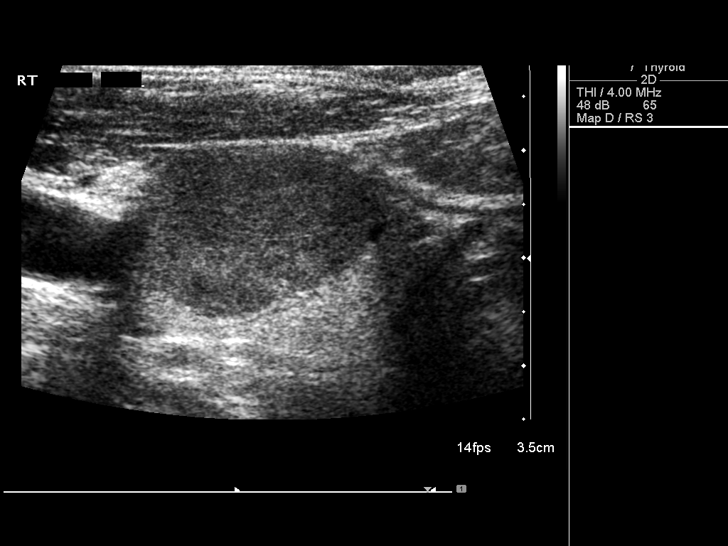
[im 24/39]
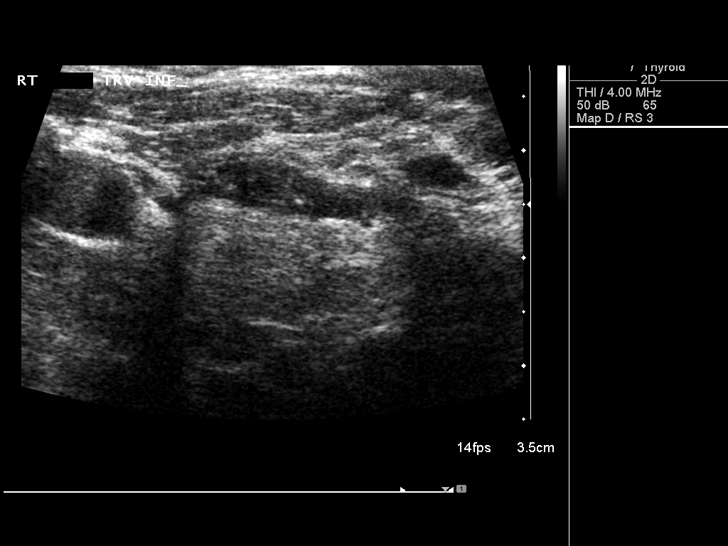
[im 26/39]
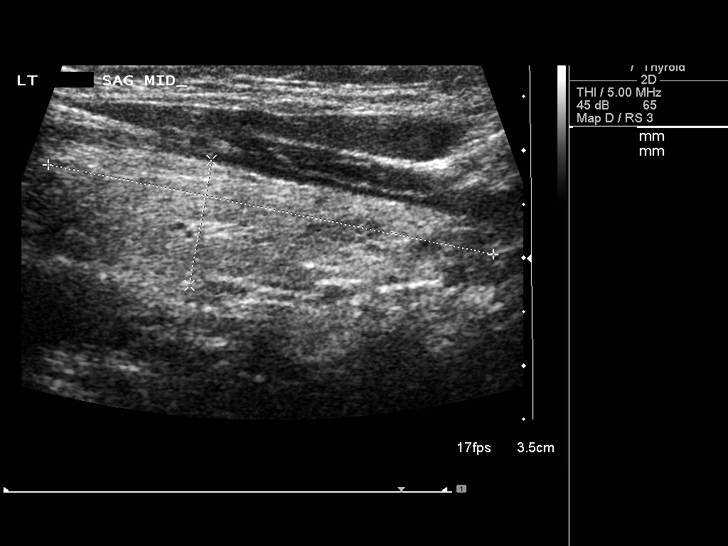
[im 29/39]
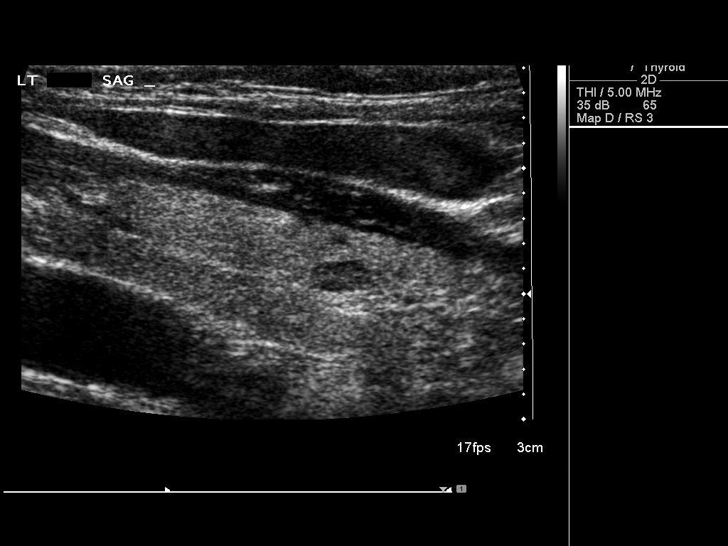
[im 32/39]
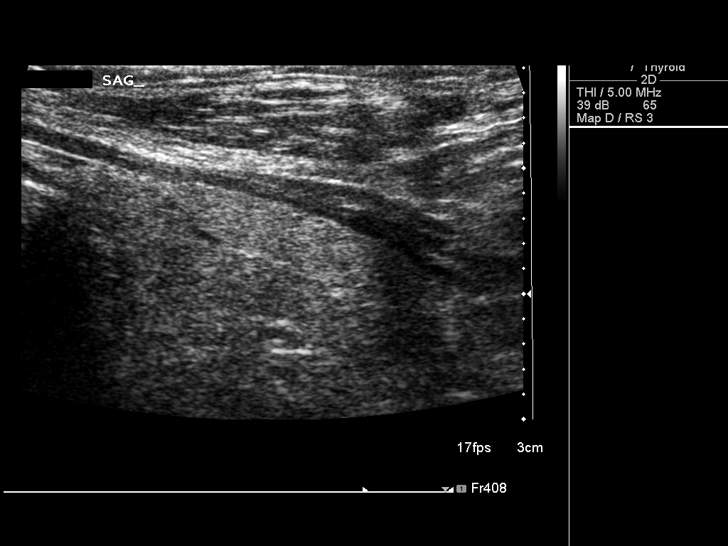
[im 35/39]
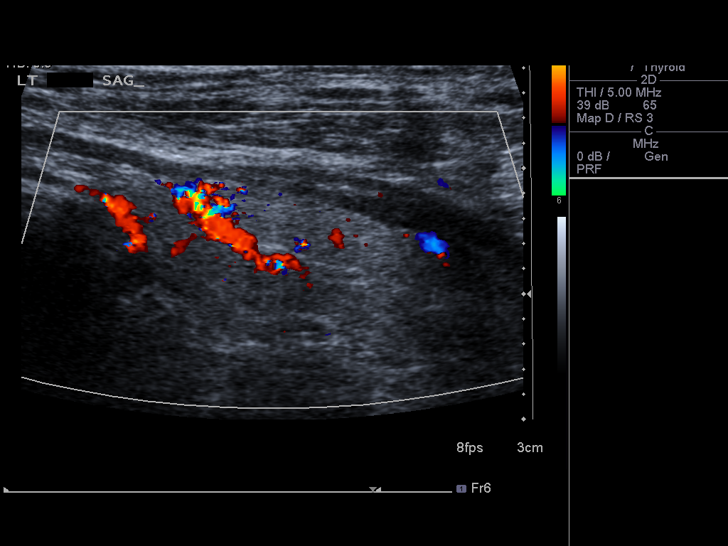
[im 39/39]
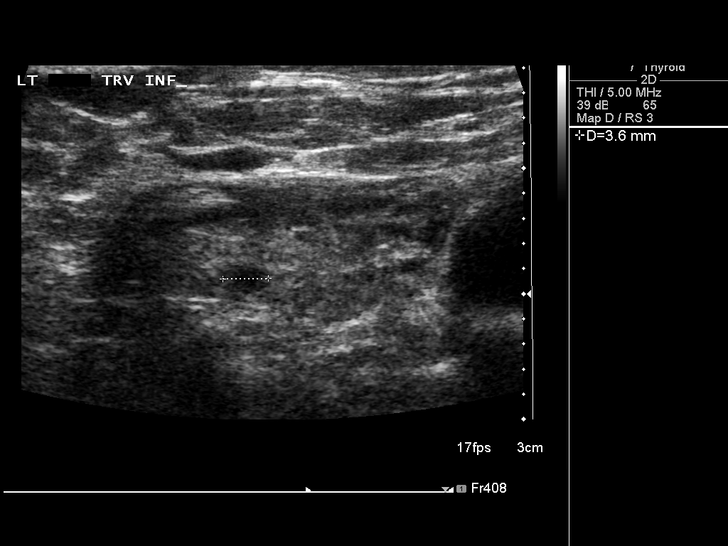

[14 of 25 positions shown; findings below may reference images not displayed]

FINDINGS: Right thyroid lobe

Measurements: 4.3 x 2.5 x 2.0 cm. Hypoechoic and hypervascular mid
lobe nodule measures 2.7 x 1.8 x 1.9 cm.

Left thyroid lobe

Measurements: 4.2 x 1.2 x 1.3 cm. Tiny lower pole nodules measure 5
mm and 4 mm.

Isthmus

Thickness: 6 mm.  No nodules visualized.

Lymphadenopathy

None visualized.
IMPRESSION: Bilateral nodules. Dominant right lobe nodule measures 2.7 cm.
Findings meet consensus criteria for biopsy. Ultrasound-guided fine
needle aspiration should be considered, as per the consensus
statement: Management of Thyroid Nodules Detected at US: Society of
Radiologists in Ultrasound Consensus Conference Statement. Radiology

## 2015-11-10 IMAGING — US US THYROID BIOPSY
1 series · 12 of 12 positions shown · non-contrast
Comparison: US Soft Tissue Head/Neck 02/15/2015

MEDICATIONS:
None

COMPLICATIONS:
None immediate

INDICATION: Indeterminate thyroid nodule

EXAM:
ULTRASOUND GUIDED FINE NEEDLE ASPIRATION OF INDETERMINATE THYROID
NODULE
TECHNIQUE: Informed written consent was obtained from the patient after a
discussion of the risks, benefits and alternatives to treatment.
Questions regarding the procedure were encouraged and answered. A
timeout was performed prior to the initiation of the procedure.

[Series 1: us thyroid biopsy · 0.07mm/px · 12 acquisitions, 12 frames shown]
[im 1/12]
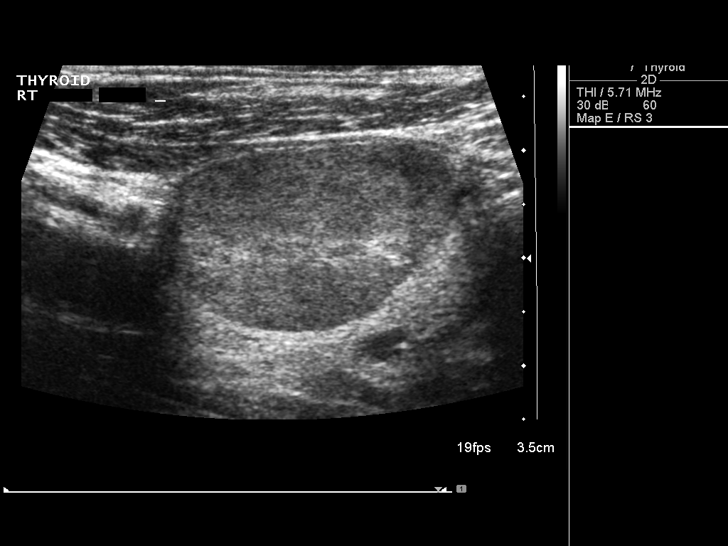
[im 2/12]
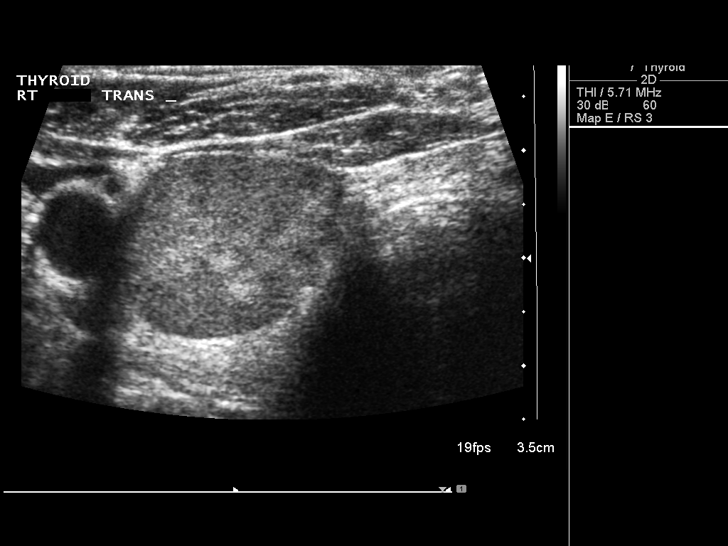
[im 3/12]
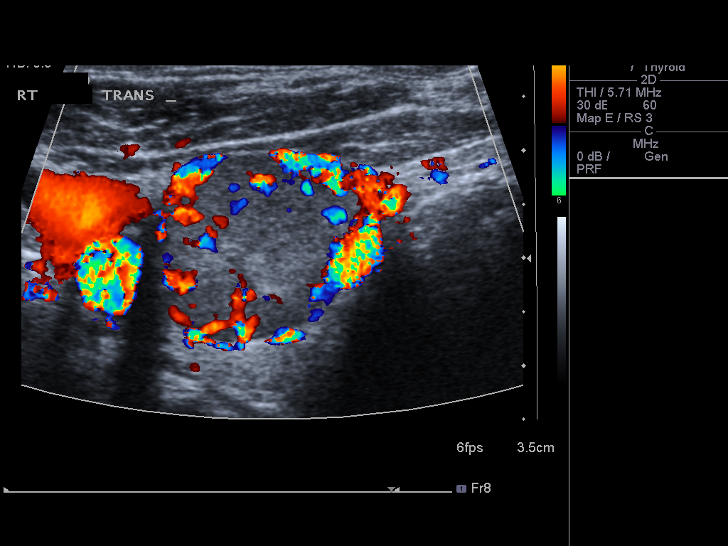
[im 4/12]
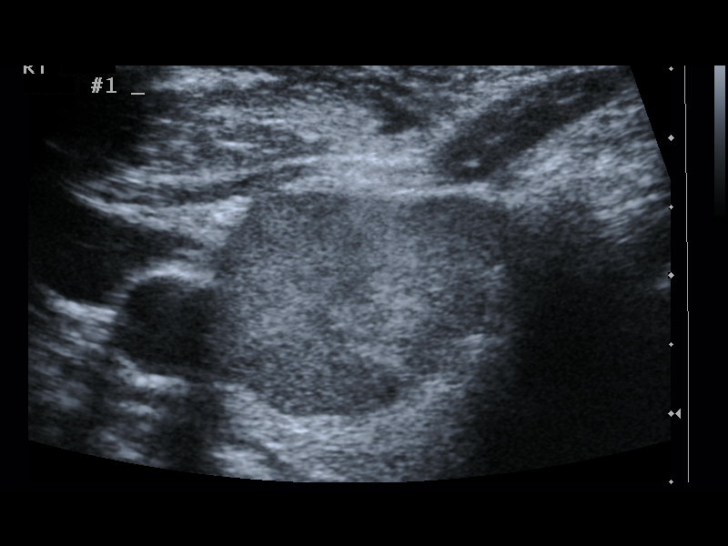
[im 5/12]
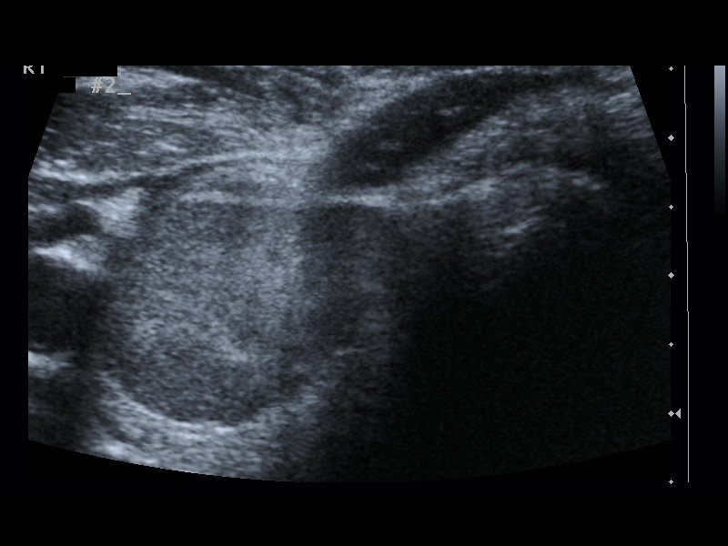
[im 6/12]
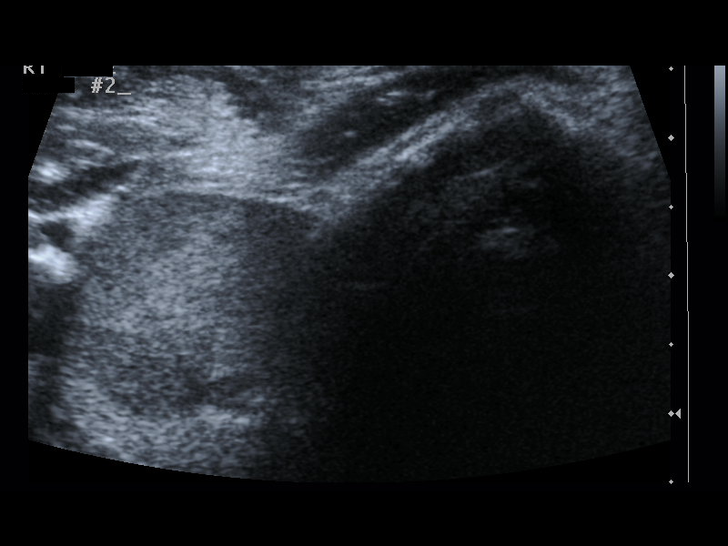
[im 7/12]
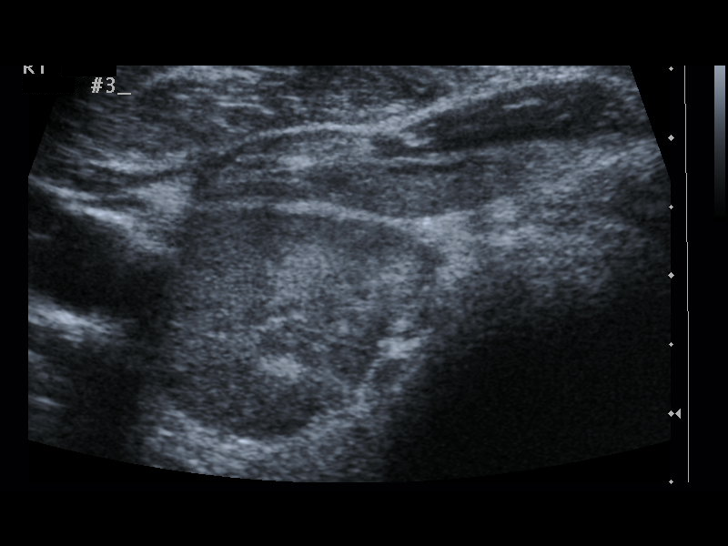
[im 8/12]
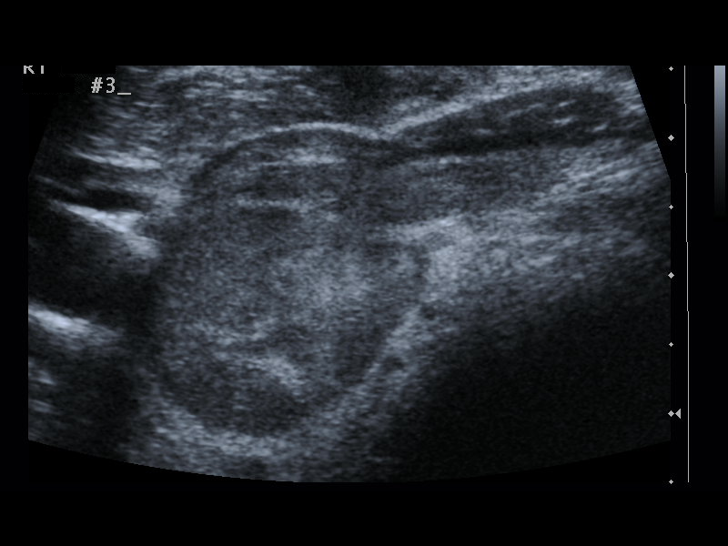
[im 9/12]
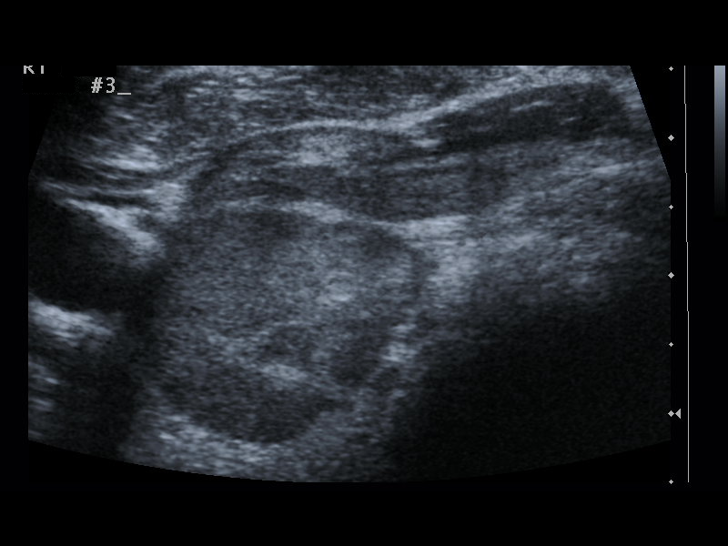
[im 10/12]
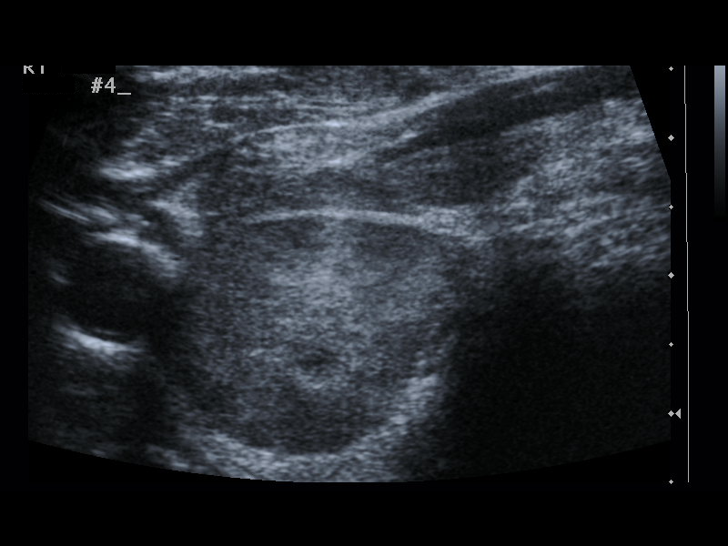
[im 11/12]
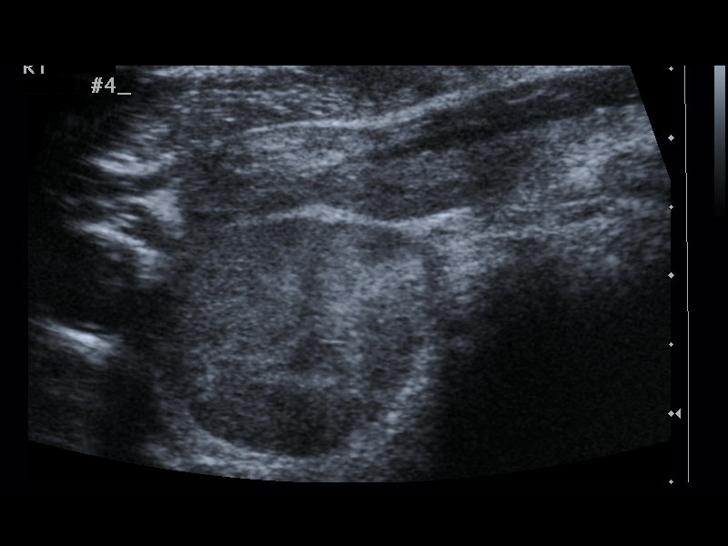
[im 12/12]
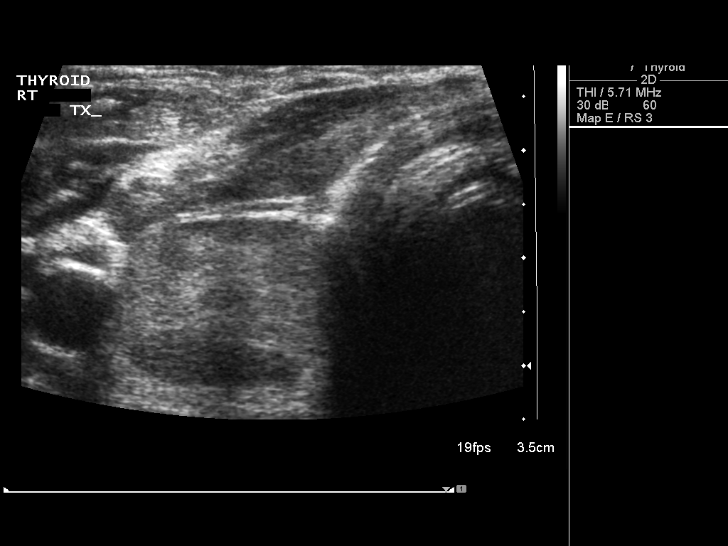

[12 of 12 positions shown; findings below may reference images not displayed]

Pre-procedural ultrasound scanning demonstrated a hypoechoic and
hypervascular right mid lobe thyroid nodule measuring 2.7 x 1.8 x
1.9 cm.

The procedure was planned. The neck was prepped in the usual sterile
fashion, and a sterile drape was applied covering the operative
field. A timeout was performed prior to the initiation of the
procedure. Local anesthesia was provided with 1% lidocaine.

Under direct ultrasound guidance, 4 FNA biopsies were performed of
the right mid lobe thyroid nodule with a 27 gauge needle. The
samples were prepared and submitted to pathology.

Limited post procedural scanning was negative for hematoma or
additional complication. Dressings were placed. The patient
tolerated the above procedures procedure well without immediate
postprocedural complication.
IMPRESSION: Technically successful ultrasound guided fine needle aspiration of
right mid lobe thyroid nodule.

## 2016-01-15 ENCOUNTER — Telehealth (HOSPITAL_COMMUNITY): Payer: Self-pay | Admitting: Cardiology

## 2016-01-15 NOTE — Telephone Encounter (Signed)
01/15/16 Called pt and spoke with him and he voiced that he did not want this test. So I will be removing him from the workqueue. This was suppose to be a 6 month f/u test.

## 2016-10-23 ENCOUNTER — Encounter (HOSPITAL_COMMUNITY): Payer: Self-pay

## 2016-10-23 ENCOUNTER — Emergency Department (HOSPITAL_COMMUNITY)
Admission: EM | Admit: 2016-10-23 | Discharge: 2016-10-24 | Disposition: A | Discharge status: Home / Self Care | Payer: Medicare Other | Attending: Emergency Medicine | Admitting: Emergency Medicine

## 2016-10-23 DIAGNOSIS — Z8673 Personal history of transient ischemic attack (TIA), and cerebral infarction without residual deficits: Secondary | ICD-10-CM | POA: Insufficient documentation

## 2016-10-23 DIAGNOSIS — W57XXXA Bitten or stung by nonvenomous insect and other nonvenomous arthropods, initial encounter: Secondary | ICD-10-CM

## 2016-10-23 DIAGNOSIS — T63461A Toxic effect of venom of wasps, accidental (unintentional), initial encounter: Secondary | ICD-10-CM | POA: Insufficient documentation

## 2016-10-23 DIAGNOSIS — Z7982 Long term (current) use of aspirin: Secondary | ICD-10-CM | POA: Insufficient documentation

## 2016-10-23 DIAGNOSIS — Z79899 Other long term (current) drug therapy: Secondary | ICD-10-CM | POA: Diagnosis not present

## 2016-10-23 NOTE — ED Triage Notes (Signed)
Pt was bit on the right side of his face and his left hand by yellow jackets, pts face and hand is swollen, red, and painful Pt is not having any trouble breathing

## 2016-10-24 MED ORDER — DIPHENHYDRAMINE HCL 25 MG PO CAPS
25.0000 mg | ORAL_CAPSULE | Freq: Once | ORAL | Status: DC
  Filled 2016-10-24: qty 1

## 2016-10-24 MED ORDER — DIPHENHYDRAMINE HCL 25 MG PO CAPS
ORAL_CAPSULE | ORAL | Status: AC
  Administered 2016-10-24: 25 mg
  Filled 2016-10-24: qty 1

## 2016-10-24 MED ORDER — EPINEPHRINE 0.3 MG/0.3ML IJ SOAJ: 0.3000 mg | Freq: Once | INTRAMUSCULAR | 1 refills | Status: AC

## 2016-10-24 MED ORDER — FAMOTIDINE 20 MG PO TABS
20.0000 mg | ORAL_TABLET | Freq: Once | ORAL | Status: AC
  Administered 2016-10-24: 20 mg via ORAL
  Filled 2016-10-24: qty 1

## 2016-10-24 NOTE — ED Provider Notes (Signed)
[name] Provider Note   CSN: [id number] Arrival date & time: 10/23/16  2136     History   Chief Complaint Chief Complaint  Patient presents with  . Insect Bite    HPI Ronald Blevins is a 83 y.o. Blevins.  HPI Ronald Blevins with no significant past medical history presents to the emergency Department today with complaints of yellow jacket stings. Patient states that he was stung on the right side of his face and his left hand. States that he's been applying over-the-counter steroid cream at home. Reports mild pain. He states that his wife had removed the stinger from this site, and he denies any difficulties breathing, difficulty swallowing. States he is also stung by yellow jacket before without any reaction. Denies any history of anaphylaxis. Denies any lightheadedness, dizziness, chest pain, shortness of breath, nausea, vomiting. This happened prior to arrival. Denies any pain or fever.  Past Medical History:  Diagnosis Date  . Chest pain   . Colon polyp   . Dizziness   . Dyslipidemia   . History of colon polyps   . Hypercholesterolemia   . PSA elevation   . White coat hypertension 03/09/2015    Patient Active Problem List   Diagnosis Date Noted  . White coat hypertension 03/09/2015  . TIA (transient ischemic attack) 01/08/2015  . Orthostatic hypotension 01/08/2015  . Hypercholesterolemia   . Colon polyp   . PSA elevation   . Dizziness   . History of colon polyps   . Chest pain   . Dyslipidemia     Past Surgical History:  Procedure Laterality Date  . CARDIAC CATHETERIZATION    . CATARACT EXTRACTION, BILATERAL Bilateral   . COLONOSCOPY    . TONSILLECTOMY         Home Medications    Prior to Admission medications   Medication Sig Start Date End Date Taking? Authorizing Provider  Ascorbic Acid (VITAMIN C PO) Take 1 tablet by mouth daily.    [provider]  aspirin 81 MG chewable tablet Chew 1 tablet (81 mg total) by mouth  daily. 12/31/14   Quintella Reichert, MD  B Complex Vitamins (VITAMIN B COMPLEX) TABS Take 1 tablet by mouth daily.    [provider]  EPINEPHrine 0.3 mg/0.3 mL IJ SOAJ injection Inject 0.3 mLs (0.3 mg total) into the muscle once. 10/24/16 10/24/16  Doristine Devoid, PA-C  finasteride (PROSCAR) 5 MG tablet Take 1 tablet by mouth daily. Take 1 tab daily 01/19/15   [provider]  Misc Natural Products (PROSTATE HEALTH PO) Take 1 tablet by mouth daily.    [provider]  multivitamin-lutein (OCUVITE-LUTEIN) CAPS capsule Take 1 capsule by mouth daily.    [provider]  Omega-3 Fatty Acids (FISH OIL) 1000 MG CAPS Take 1,000 mg by mouth daily.    [provider]  tamsulosin (FLOMAX) 0.4 MG CAPS capsule Take 1 capsule by mouth daily. Take 1Tab daily 01/19/15   [provider]    Family History Family History  Problem Relation Age of Onset  . Alzheimer's disease Mother   . Dementia Father   . Cancer - Colon Sister   . Cancer Sister        pancreatic    Social History Social History  Substance Use Topics  . Smoking status: Never Smoker  . Smokeless tobacco: Never Used  . Alcohol use Yes     Comment: occ     Allergies   Patient has no known  allergies.   Review of Systems Review of Systems  Constitutional: Negative for chills and fever.  HENT: Negative for congestion.   Respiratory: Negative for cough and shortness of breath.   Cardiovascular: Negative for chest pain and palpitations.  Gastrointestinal: Negative for nausea and vomiting.  Musculoskeletal: Positive for joint swelling and myalgias.  Skin: Positive for color change.  Neurological: Negative for dizziness, syncope, weakness, light-headedness and headaches.     Physical Exam Updated Vital Signs BP (!) 158/92 (BP Location: Right Arm)   Pulse 80   Temp 98.1 F (36.7 C) (Oral)   Resp 18   SpO2 99%   Physical Exam  Constitutional: He is oriented to person, place,  and time. He appears well-developed and well-nourished. No distress.  HENT:  Head: Normocephalic and atraumatic.  Oropharynx is clear. No angioedema.  Eyes: Conjunctivae and EOM are normal. Pupils are equal, round, and reactive to light. Right eye exhibits no discharge. Left eye exhibits no discharge. No scleral icterus.  Erythema and edema surrounding the right orbit and right cheek.  Neck: Normal range of motion. Neck supple.  Cardiovascular: Normal rate, regular rhythm, normal heart sounds and intact distal pulses.   Pulmonary/Chest: Effort normal and breath sounds normal. No respiratory distress. He has no wheezes. He has no rales. He exhibits no tenderness.  Speaking complete sentences and maintaining his airway.  Musculoskeletal: Normal range of motion.  Patient with full range of motion of his left hand and wrist. Radial pulses 2+ bilaterally. Sensation intact. Cap refill normal.  Neurological: He is alert and oriented to person, place, and time.  Skin: Skin is warm and dry. Capillary refill takes less than 2 seconds. No pallor.  Patient was swelling to the dorsum of his left hand with mild erythema.  Psychiatric: His behavior is normal. Judgment and thought content normal.  Nursing note and vitals reviewed.    ED Treatments / Results  Labs (all labs ordered are listed, but only abnormal results are displayed) Labs Reviewed - No data to display  EKG  EKG Interpretation None       Radiology No results found.  Procedures Procedures (including critical care time)  Medications Ordered in ED Medications  diphenhydrAMINE (BENADRYL) capsule 25 mg (25 mg Oral Not Given 10/24/16 0012)  famotidine (PEPCID) tablet 20 mg (20 mg Oral Given 10/24/16 0012)  diphenhydrAMINE (BENADRYL) 25 mg capsule (25 mg  Given 10/24/16 0012)     Initial Impression / Assessment and Plan / ED Course  I have reviewed the triage vital signs and the nursing notes.  Pertinent labs & imaging results that  were available during my care of the patient were reviewed by me and considered in my medical decision making (see chart for details).     Patient presents to the emergency Department today with complaints of bee sting to his left hand and right cheek. Patient denies any anaphylaxis complaints including difficulty breathing, difficulty swallowing, lightheadedness, dizziness. Vital signs are stable. Doubt infection given acute onset. Likely allergic reaction to the bite. Will give Benadryl and Pepcid. Epi pen was given for any worsening symptoms. Strict return precautions were dicussed with pt. Pt seen and eval by Dr. Rex Kras who is agreeable to the above plan.  Final Clinical Impressions(s) / ED Diagnoses   Final diagnoses:  Insect bite, initial encounter    New Prescriptions Discharge Medication List as of 10/24/2016 12:04 AM    START taking these medications   Details  EPINEPHrine 0.3 mg/0.3 mL IJ SOAJ  injection Inject 0.3 mLs (0.3 mg total) into the muscle once., Starting Fri 10/24/2016, Print         Doristine Devoid, PA-C 10/24/16 0142    Little, Wenda Overland, MD 10/24/16 [phone]

## 2016-10-24 NOTE — Discharge Instructions (Signed)
Continue taking Benadry to help with the swelling. Please take a pepcid every day. Will give you a prescription for epi pen. Use as discussed with Dr. Rex Kras. Return to the ED if your symptoms worsen if you have difficulties breathing and into the EpiPen.

## 2020-01-23 NOTE — Progress Notes (Addendum)
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 Dr. Nicole Kindred will counsel him on preventative strategies, could consider cholinesterase inhibitor as deemed appropriate.   HPI:  Ronald Blevins is a 86 y.o. male here as requested by Lawerance Cruel, MD for "getting lost at times". PMHx pure hypercholesterolemia, BPH, memory deficit, gout, sinusitis. I reviewed Dr. Alan Ripper notes: He does take a vitamin B complex daily, aspirin, I reviewed notes from Dr. Harrington Challenger: I not see any history of his memory changes or complaints in the notes that I received. I do see that vitamin B12 was ordered on December 12, 2019 and returned with a value of 300, all it says that patient is getting lost at times and was referred here for evaluation. I reviewed Epic and Care Everywhere and I don't see any other notes discussing any memory concerns either.   He is concerned about his memory. He has gotten lost, started about a year ago, started forgetting people's names and how to get places he has been to. He gets to places and has forgotten how to get home, he can get back and forth eventually. He is still social with family but Covid has made golfing difficult. He keeps bisy at home, fixing things around the house, he takes care of the plants, he is active. He has designed his own complicated wine rack, no accidents with driving, no accidents at home. He manages the finances, still doing well balancing the checkbooks, not confused with dates, he misplaces his glasses "always looking for my glasses". 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 Wife is here and provides info, she says he sometimes repeats things about where they  are going but he has never paid attention (they laugh), he may ask where they are going multiple times in the same day, his driving is very good and his reflexes are good. It's really mostly being able to remember a route, he is aware with the landmarks but he forgets. [place] would take a circuitous route instead of the direct route he should know to take. Mother had Alzheimers, father had undiagnosed dementia in their 34s-80s. His brother passed away 8 years ago at the age of 79 without anymemory issues, other brother dies at 67. He snores, he kicks all night.   Reviewed notes, labs and imaging from outside physicians, which showed:  CT of the head in 2016 was completed for dizziness, personally reviewed images and agree with the following:Diffuse cerebral atrophy. Low-attenuation changes in the deep white matter consistent small vessel ischemia. Large CSF space in the right middle cranial fossa probably representing arachnoid cyst. No mass effect or midline shift. Gray-white matter junctions are distinct. Basal cisterns are not effaced. No acute intracranial hemorrhage. Visualized paranasal sinuses and mastoid air cells are not opacified. No depressed skull fractures.  IMPRESSION: CSF space in the right middle cranial fossa probably representing an arachnoid cyst. No acute intracranial abnormalities. Chronic atrophy and small vessel ischemic changes. I would also add is difficult to say with the extent of the white matter changes are on CAT scan and the cerebral atrophy does appear mildly more pronounced in  the mesial temporal lobes.   Review of Systems: Patient complains of symptoms per HPI as well as the following symptoms: forgetfullness. Pertinent negatives and positives per HPI. All others negative.   Social History   Socioeconomic History  . Marital status: Married    Spouse name: Not on file  . Number of children: 3  . Years of  education: Not on file  . Highest education level: Master's degree (e.g., MA, MS, MEng, MEd, MSW, MBA)  Occupational History  . Not on file  Tobacco Use  . Smoking status: Current Some Day Smoker    Types: Cigars  . Smokeless tobacco: Never Used  . Tobacco comment: once in awhile a cigar  Substance and Sexual Activity  . Alcohol use: Yes    Comment: 8 oz wine with dinner   . Drug use: No  . Sexual activity: Not on file    Comment: passive smoke from cigars  Other Topics Concern  . Not on file  Social History Narrative   Lives at home with wife   Social Determinants of Health   Financial Resource Strain:   . Difficulty of Paying Living Expenses: Not on file  Food Insecurity:   . Worried About Charity fundraiser in the Last Year: Not on file  . Ran Out of Food in the Last Year: Not on file  Transportation Needs:   . Lack of Transportation (Medical): Not on file  . Lack of Transportation (Non-Medical): Not on file  Physical Activity:   . Days of Exercise per Week: Not on file  . Minutes of Exercise per Session: Not on file  Stress:   . Feeling of Stress : Not on file  Social Connections:   . Frequency of Communication with Friends and Family: Not on file  . Frequency of Social Gatherings with Friends and Family: Not on file  . Attends Religious Services: Not on file  . Active Member of Clubs or Organizations: Not on file  . Attends Archivist Meetings: Not on file  . Marital Status: Not on file  Intimate Partner Violence:   . Fear of Current or Ex-Partner: Not on file  . Emotionally Abused: Not on file  . Physically Abused: Not on file  . Sexually Abused: Not on file    Family History  Problem Relation Age of Onset  . Alzheimer's disease Mother   . Pneumonia Mother   . Dementia Father   . Cancer - Colon Sister   . Cancer Sister        pancreatic    Past Medical History:  Diagnosis Date  . BPH (benign prostatic hyperplasia)    per notes from Dr  Lona Kettle  . Chest pain   . Colon polyp   . Dizziness   . Dyslipidemia   . Gout, unspecified    per notes from Dr Lona Kettle  . History of colon polyps   . Hypercholesterolemia   . PSA elevation   . White coat hypertension 03/09/2015    Patient Active Problem List   Diagnosis Date Noted  . Mild memory disturbance 01/24/2020  . White coat hypertension 03/09/2015  . TIA (transient ischemic attack) 01/08/2015  . Orthostatic hypotension 01/08/2015  . Hypercholesterolemia   . Colon polyp   . PSA elevation   . Dizziness   . History of colon polyps   . Chest pain   . Dyslipidemia     Past Surgical History:  Procedure Laterality Date  . CARDIAC  CATHETERIZATION    . CATARACT EXTRACTION, BILATERAL Bilateral   . COLONOSCOPY    . TONSILLECTOMY      Current Outpatient Medications  Medication Sig Dispense Refill  . Ascorbic Acid (VITAMIN C PO) Take 500 mg by mouth daily.     Marland Kitchen aspirin 81 MG chewable tablet Chew 1 tablet (81 mg total) by mouth daily. 30 tablet 0  . cholecalciferol (VITAMIN D3) 25 MCG (1000 UNIT) tablet Take 1,000 Units by mouth daily.    . finasteride (PROSCAR) 5 MG tablet Take 1 tablet by mouth daily. Take 1 tab daily    . Misc Natural Products (BLACK CHERRY CONCENTRATE PO) Take by mouth.    . multivitamin-lutein (OCUVITE-LUTEIN) CAPS capsule Take 1 capsule by mouth daily.    . Omega-3 Fatty Acids (FISH OIL) 1200 MG CAPS Take 1,200 mg by mouth daily.    . tamsulosin (FLOMAX) 0.4 MG CAPS capsule Take 0.8 mg by mouth daily. Take 1Tab daily    . vitamin B-12 (CYANOCOBALAMIN) 1000 MCG tablet Take 1,000 mcg by mouth daily.     No current facility-administered medications for this visit.    Allergies as of 01/24/2020  . (No Known Allergies)    Vitals: BP 137/78 (BP Location: Right Arm, Patient Position: Sitting)   Pulse 68   Ht 5\' 10"  (1.778 m)   Wt 181 lb (82.1 kg)   BMI 25.97 kg/m  Last Weight:  Wt Readings from Last 1 Encounters:  01/24/20 181 lb  (82.1 kg)   Last Height:   Ht Readings from Last 1 Encounters:  01/24/20 5\' 10"  (1.778 m)     Physical exam: Exam: Gen: NAD, conversant, well nourised, well groomed                     CV: RRR, no MRG. No Carotid Bruits. No peripheral edema, warm, nontender Eyes: Conjunctivae clear without exudates or hemorrhage  Neuro: Detailed Neurologic Exam  Speech:    Speech is normal; fluent and spontaneous with normal comprehension.  Cognition:    The patient is oriented to person, place, and time;     recent and remote memory intact;     language fluent;     normal attention, concentration,     fund of knowledge Cranial Nerves:    The pupils are equal, round, and reactive to light. Attempted, pupils too small to visualize fundi. Visual fields are full to finger confrontation. Extraocular movements are intact. Trigeminal sensation is intact and the muscles of mastication are normal. The face is symmetric. The palate elevates in the midline. Hearing intact. Voice is normal. Shoulder shrug is normal. The tongue has normal motion without fasciculations.   Coordination:    No dysmetria/ataxia  Gait:    Heel-toe and tandem gait are normal.   Motor Observation:    No asymmetry, no atrophy, and no involuntary movements noted. Tone:    Normal muscle tone.    Posture:    Posture is normal. normal erect    Strength:    Strength is V/V in the upper and lower limbs.      Sensation: intact to LT     Reflex Exam:  DTR's:    Deep tendon reflexes in the upper and lower extremities are brisk bilaterally.   Toes:    The toes are downgoing bilaterally.   Clonus:    Clonus is absent.    Assessment/Plan:  [age] year old with a FHx dementia here for mild memory loss.  Need dementia evaluation.   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 Dr. Nicole Kindred will counsel him on preventative  strategies, could consider cholinesterase inhibitor as deemed appropriate.   MRI of the brain w/wo contrast Formal Memory Testing Blood work Likely PLMS, wife had to move out of the room because he kicks so much, daytime drowsiness: explained fatigue and memory loss may be impacted by poor sleep and suggested a sleep study ESS 11  Orders Placed This Encounter  Procedures  . MR BRAIN W WO CONTRAST  . CBC  . Comprehensive metabolic panel  . B12 and Folate Panel  . Homocysteine  . Methylmalonic acid, serum  . Vitamin B1  . Ambulatory referral to Neuropsychology  . Ambulatory referral to Sleep Studies    Cc: Lawerance Cruel, MD,  Lawerance Cruel, MD  Sarina Ill, MD  [facility] [address] [place], [place] [id number]  Phone [phone] Fax [phone]

## 2020-01-24 ENCOUNTER — Encounter: Payer: Self-pay | Admitting: Neurology

## 2020-01-24 ENCOUNTER — Telehealth: Payer: Self-pay | Admitting: Neurology

## 2020-01-24 ENCOUNTER — Other Ambulatory Visit: Payer: Self-pay

## 2020-01-24 ENCOUNTER — Ambulatory Visit (INDEPENDENT_AMBULATORY_CARE_PROVIDER_SITE_OTHER): Payer: Medicare Other | Admitting: Neurology

## 2020-01-24 VITALS — BP 137/78 | HR 68 | Ht 70.0 in | Wt 181.0 lb

## 2020-01-24 DIAGNOSIS — R4189 Other symptoms and signs involving cognitive functions and awareness: Secondary | ICD-10-CM

## 2020-01-24 DIAGNOSIS — G4761 Periodic limb movement disorder: Secondary | ICD-10-CM

## 2020-01-24 DIAGNOSIS — R6889 Other general symptoms and signs: Secondary | ICD-10-CM

## 2020-01-24 DIAGNOSIS — Z818 Family history of other mental and behavioral disorders: Secondary | ICD-10-CM

## 2020-01-24 DIAGNOSIS — R413 Other amnesia: Secondary | ICD-10-CM | POA: Diagnosis not present

## 2020-01-24 DIAGNOSIS — R41842 Visuospatial deficit: Secondary | ICD-10-CM

## 2020-01-24 NOTE — Progress Notes (Addendum)
Epworth Sleepiness Scale 0= would never doze 1= slight chance of dozing 2= moderate chance of dozing 3= high chance of dozing  Sitting and reading: 2 Watching TV: 3 Sitting inactive in a public place (ex. Theater or meeting): 0 As a passenger in a car for an hour without a break: 0 Lying down to rest in the afternoon: 3 Sitting and talking to someone: 1 Sitting quietly after lunch (no alcohol): 2 In a car, while stopped in traffic: 0 Total: 11  MMSE - [place] Exam 01/24/2020  Orientation to time 5  Orientation to Place 2  Registration 3  Attention/ Calculation 4  Recall 3  Language- name 2 objects 2  Language- repeat 1  Language- follow 3 step command 3  Language- read & follow direction 1  Write a sentence 1  Copy design 1  Total score 26

## 2020-01-24 NOTE — Telephone Encounter (Signed)
[employer] medicare order sent to [facility]. No auth they will reach out to the patient to schedule.  

## 2020-01-24 NOTE — Patient Instructions (Addendum)
MRI of the brain Formal Memory Testing Blood work today We will follow up after all testing completed Consider sleep study/evaluation  Memory Compensation Strategies  1. Use "WARM" strategy.  W= write it down  A= associate it  R= repeat it  M= make a mental note  2.   You can keep a Social worker.  Use a 3-ring notebook with sections for the following: calendar, important names and phone numbers,  medications, doctors' names/phone numbers, lists/reminders, and a section to journal what you did  each day.   3.    Use a calendar to write appointments down.  4.    Write yourself a schedule for the day.  This can be placed on the calendar or in a separate section of the Memory Notebook.  Keeping a  regular schedule can help memory.  5.    Use medication organizer with sections for each day or morning/evening pills.  You may need help loading it  6.    Keep a basket, or pegboard by the door.  Place items that you need to take out with you in the basket or on the pegboard.  You may also want to  include a message board for reminders.  7.    Use sticky notes.  Place sticky notes with reminders in a place where the task is performed.  For example: " turn off the  stove" placed by the stove, "lock the door" placed on the door at eye level, " take your medications" on  the bathroom mirror or by the place where you normally take your medications.  8.    Use alarms/timers.  Use while cooking to remind yourself to check on food or as a reminder to take your medicine, or as a  reminder to make a call, or as a reminder to perform another task, etc.    Dementia Dementia is a condition that affects the way the brain functions. It often affects memory and thinking. Usually, dementia gets worse with time and cannot be reversed (progressive dementia). There are many types of dementia, including:  Alzheimer's disease. This type is the most common.  Vascular dementia. This type may happen as the  result of a stroke.  Lewy body dementia. This type may happen to people who have Parkinson's disease.  Frontotemporal dementia. This type is caused by damage to nerve cells (neurons) in certain parts of the brain. Some people may be affected by more than one type of dementia. This is called mixed dementia. What are the causes? Dementia is caused by damage to cells in the brain. The area of the brain and the types of cells damaged determine the type of dementia. Usually, this damage is irreversible or cannot be undone. Some examples of irreversible causes include:  Conditions that affect the blood vessels of the brain, such as diabetes, heart disease, or blood vessel disease.  Genetic mutations. In some cases, changes in the brain may be caused by another condition and can be reversed or slowed. Some examples of reversible causes include:  Injury to the brain.  Certain medicines.  Infection, such as meningitis.  Metabolic problems, such as vitamin B12 deficiency or thyroid disease.  Pressure on the brain, such as from a tumor or blood clot. What are the signs or symptoms? Symptoms of dementia depend on the type of dementia. Common signs of dementia include problems with remembering, thinking, problem solving, decision making, and communicating. These signs develop slowly or get worse with time. This  may include:  Problems remembering things.  Having trouble taking a bath or putting clothes on.  Forgetting appointments.  Forgetting to pay bills.  Difficulty planning and preparing meals.  Having trouble speaking.  Getting lost easily. How is this diagnosed? This condition is diagnosed by a specialist (neurologist). It is diagnosed based on the history of your symptoms, your medical history, a physical exam, and tests. Tests may include:  Tests to evaluate brain function, such as memory tests, cognitive tests, and other tests.  Lab tests, such as blood or urine  tests.  Imaging tests, such as a CT scan, a PET scan, or an MRI.  Genetic testing. This may be done if other family members have a diagnosis of certain types of dementia. Your health care provider will talk with you and your family, friends, or caregivers about your history and symptoms. How is this treated?  Treatment for this condition depends on the cause of the dementia. Progressive dementias, such as Alzheimer's disease, cannot be cured, but there may be treatments that help to manage symptoms. Treatment might involve taking medicines that may help to:  Control the dementia.  Slow down the progression of the dementia.  Manage symptoms. In some cases, treating the cause of your dementia can improve symptoms, reverse symptoms, or slow down how quickly your dementia becomes worse. Your health care provider can direct you to support groups, organizations, and other health care providers who can help with decisions about your care. Follow these instructions at home: Medicines  Take over-the-counter and prescription medicines only as told by your health care provider.  Use a pill organizer or pill reminder to help you manage your medicines.  Avoid taking medicines that can affect thinking, such as pain medicines or sleeping medicines. Lifestyle  Make healthy lifestyle choices. ? Be physically active as told by your health care provider. ? Do not use any products that contain nicotine or tobacco, such as cigarettes, e-cigarettes, and chewing tobacco. If you need help quitting, ask your health care provider. ? Do not drink alcohol. ? Practice stress-management techniques when you get stressed. ? Spend time with other people.  Make sure to get quality sleep. These tips can help you get a good night's rest: ? Avoid napping during the day. ? Keep your sleeping area dark and cool. ? Avoid exercising during the few hours before you go to bed. ? Avoid caffeine products in the  evening. Eating and drinking  Drink enough fluid to keep your urine pale yellow.  Eat a healthy diet. General instructions   Work with your health care provider to determine what you need help with and what your safety needs are.  Talk with your health care provider about whether it is safe for you to drive.  If you were given a bracelet that identifies you as a person with memory loss or tracks your location, make sure to wear it at all times.  Work with your family to make important decisions, such as advance directives, medical power of attorney, or a living will.  Keep all follow-up visits as told by your health care provider. This is important. Where to find more information  Alzheimer's Association: CapitalMile.co.nz  National Institute on Aging: DVDEnthusiasts.nl  World Health Organization: RoleLink.com.br Contact a health care provider if:  You have any new or worsening symptoms.  You have problems with choking or swallowing. Get help right away if:  You feel depressed or sad, or feel that you want to harm yourself.  Your family members become concerned for your safety. If you ever feel like you may hurt yourself or others, or have thoughts about taking your own life, get help right away. You can go to your nearest emergency department or call:  Your local emergency services (911 in the [place]  A suicide crisis helpline, such as the [facility] at 9407098690. This is open 24 hours a day. Summary  Dementia is a condition that affects the way the brain functions. Dementia often affects memory and thinking.  Usually, dementia gets worse with time and cannot be reversed (progressive dementia).  Treatment for this condition depends on the cause of the dementia.  Work with your health care provider to determine what you need help with and what your safety needs are.  Your health care provider can direct you to support groups,  organizations, and other health care providers who can help with decisions about your care. This information is not intended to replace advice given to you by your health care provider. Make sure you discuss any questions you have with your health care provider. Document Revised: 06/22/2018 Document Reviewed: 06/22/2018 Elsevier Patient Education  [place].

## 2020-01-25 ENCOUNTER — Ambulatory Visit: Payer: Medicare Other | Admitting: Diagnostic Neuroimaging

## 2020-01-26 ENCOUNTER — Telehealth: Payer: Self-pay | Admitting: *Deleted

## 2020-01-26 NOTE — Telephone Encounter (Signed)
Spoke with pt, wife was on the other end of the line listening as well. Patient/wife advised of lab results, so far they are fine, will call if last lab abnormal. Wife's questions were answered. They both verbalized appreciation for the call.

## 2020-01-26 NOTE — Telephone Encounter (Signed)
-----   Message from Melvenia Beam, MD sent at 01/26/2020  1:14 PM EDT ----- Blood work looks fine so far. Still awaiting  one lab to come back and we will let him know if that is abnormal thanks

## 2020-01-28 ENCOUNTER — Ambulatory Visit
Admission: RE | Admit: 2020-01-28 | Discharge: 2020-01-28 | Disposition: A | Discharge status: Home / Self Care | Payer: Medicare Other | Source: Ambulatory Visit | Attending: Neurology | Admitting: Neurology

## 2020-01-28 DIAGNOSIS — R413 Other amnesia: Secondary | ICD-10-CM

## 2020-01-28 DIAGNOSIS — R4189 Other symptoms and signs involving cognitive functions and awareness: Secondary | ICD-10-CM

## 2020-01-28 DIAGNOSIS — Z818 Family history of other mental and behavioral disorders: Secondary | ICD-10-CM

## 2020-01-28 DIAGNOSIS — R6889 Other general symptoms and signs: Secondary | ICD-10-CM

## 2020-01-28 DIAGNOSIS — R41842 Visuospatial deficit: Secondary | ICD-10-CM

## 2020-01-28 LAB — VITAMIN B1: Thiamine: 152.9 nmol/L (ref 66.5–200.0)

## 2020-01-28 LAB — COMPREHENSIVE METABOLIC PANEL
ALT: 11 IU/L (ref 0–44)
AST: 16 IU/L (ref 0–40)
Albumin/Globulin Ratio: 2.1 (ref 1.2–2.2)
Albumin: 4.5 g/dL (ref 3.6–4.6)
Alkaline Phosphatase: 54 IU/L (ref 44–121)
BUN/Creatinine Ratio: 20 (ref 10–24)
BUN: 17 mg/dL (ref 8–27)
Bilirubin Total: 0.6 mg/dL (ref 0.0–1.2)
CO2: 25 mmol/L (ref 20–29)
Calcium: 9.6 mg/dL (ref 8.6–10.2)
Chloride: 103 mmol/L (ref 96–106)
Creatinine, Ser: 0.84 mg/dL (ref 0.76–1.27)
GFR calc Af Amer: 92 mL/min/{1.73_m2} (ref >59)
GFR calc non Af Amer: 80 mL/min/{1.73_m2} (ref >59)
Globulin, Total: 2.1 g/dL (ref 1.5–4.5)
Glucose: 99 mg/dL (ref 65–99)
Potassium: 4.3 mmol/L (ref 3.5–5.2)
Sodium: 139 mmol/L (ref 134–144)
Total Protein: 6.6 g/dL (ref 6.0–8.5)

## 2020-01-28 LAB — CBC
Hematocrit: 45.6 % (ref 37.5–51.0)
Hemoglobin: 15.3 g/dL (ref 13.0–17.7)
MCH: 31 pg (ref 26.6–33.0)
MCHC: 33.6 g/dL (ref 31.5–35.7)
MCV: 93 fL (ref 79–97)
Platelets: 234 10*3/uL (ref 150–450)
RBC: 4.93 x10E6/uL (ref 4.14–5.80)
RDW: 12.8 % (ref 11.6–15.4)
WBC: 7.7 10*3/uL (ref 3.4–10.8)

## 2020-01-28 LAB — METHYLMALONIC ACID, SERUM: Methylmalonic Acid: 144 nmol/L (ref 0–378)

## 2020-01-28 LAB — B12 AND FOLATE PANEL
Folate: 13 ng/mL (ref >3.0)
Vitamin B-12: 562 pg/mL (ref 232–1245)

## 2020-01-28 LAB — HOMOCYSTEINE: Homocysteine: 10.2 umol/L (ref 0.0–21.3)

## 2020-01-28 MED ORDER — GADOBENATE DIMEGLUMINE 529 MG/ML IV SOLN
15.0000 mL | Freq: Once | INTRAVENOUS | Status: AC | PRN
  Administered 2020-01-28: 15 mL via INTRAVENOUS

## 2020-01-30 ENCOUNTER — Ambulatory Visit (INDEPENDENT_AMBULATORY_CARE_PROVIDER_SITE_OTHER): Payer: Medicare Other | Admitting: Counselor

## 2020-01-30 ENCOUNTER — Other Ambulatory Visit: Payer: Self-pay

## 2020-01-30 ENCOUNTER — Encounter: Payer: Self-pay | Admitting: Counselor

## 2020-01-30 ENCOUNTER — Ambulatory Visit: Payer: Medicare Other | Admitting: Psychology

## 2020-01-30 DIAGNOSIS — F09 Unspecified mental disorder due to known physiological condition: Secondary | ICD-10-CM

## 2020-01-30 DIAGNOSIS — R413 Other amnesia: Secondary | ICD-10-CM

## 2020-01-30 NOTE — Progress Notes (Signed)
[facility]  Patient Name: Ronald Blevins MRN: [id number] Date of Birth: 1932-12-11 Age: 86 y.o. Education: 18 years  Referral Circumstances and Background Information  Ronald Blevins is a 85 y.o., right-hand dominant, married man with a history of HLD, BPH, gout and problems with memory and thinking over about the past year. He was referred by Dr. Jaynee Eagles at [facility] who evaluated him for the same complaints.    On interview, the patient reported that he started noticing problems recalling people's names and street names, over about the past year. The changes were not associated with anything specific. They think that his issues are worsening somewhat over time. On specific review of symptoms, the patient reported that he is forgetting things his wife tells him, over the course of a day. He doesn't forget entire events, it's more details of things. He doesn't have rapid forgetting of information. He will repeat himself sometimes. He is not as good with dates and he was in the past, although he compensates using a calendar book. He may forget things if he doesn't write them down. He has some minor word finding problems. They largely denied any changes in behavior, personality, hallucinations, or delusions. With respect to mood, the patient reported that he is generally fairly even keeled and denied feeling depressed. He stated that his energy is good, his wife doesn't agree. He is active and has some hobbies, he does a lot of yard work and also builds things in his shop. He isn't eating as much as he did in the past, but he is still eating adequately, he just doesn't get quite as hungry. With respect to sleep, he said he is sleeping typically more than in the past, sometimes 12 hours. He often goes to bed early after dinner. He then gets up and eats ice cream at 12:30pm and will be up for a few hours at a time. He has likely PLMS as per Dr. Jaynee Eagles, wife had to move out  of bed because he was kicking her so much, a sleep study has been ordered.    With respect to functioning, the patient is still doing well and largely denied that there's anything he used to do that he cannot do now. He is still managing the finances and does so without incident, there aren't any missed bills. He has a harder time finding where he wants to go when driving, although he isn't getting lost and has no accidents. His wife started that he is still a good driver and that he has good reflexes. He typically brings her to help with directions or will use GPS at times. With respect to judgment and problem solving, he thinks that his judgment and problem solving are about as good as they have been in the past, and his wife agrees. He works in a [employer] and does well with that. He has no problems operating tools. There are no changes in his ability to use a computer or electronics. He does some cooking, and is able to execute it at his normal level of skill. No changes in basic ADLs.   Past Medical History and Review of Relevant Studies   Patient Active Problem List   Diagnosis Date Noted  . Mild memory disturbance 01/24/2020  . White coat hypertension 03/09/2015  . TIA (transient ischemic attack) 01/08/2015  . Orthostatic hypotension 01/08/2015  . Hypercholesterolemia   . Colon polyp   . PSA elevation   . Dizziness   .  History of colon polyps   . Chest pain   . 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 There are mild to moderate atrophic changes, I concur with Dr. Jaynee Eagles that there is somewhat more pronounced volume loss in the mesial temporal areas but also the parietal areas. There is a mild burden of leukoaraiosis, borderline as a  contributor to cognitive problems. The imaging might raise one's concern about age-related neurodegenerative causes of cognitive decline, specifically AD, given the profile of volume loss.   Current Outpatient Medications  Medication Sig Dispense Refill  . Ascorbic Acid (VITAMIN C PO) Take 500 mg by mouth daily.     Marland Kitchen aspirin 81 MG chewable tablet Chew 1 tablet (81 mg total) by mouth daily. 30 tablet 0  . cholecalciferol (VITAMIN D3) 25 MCG (1000 UNIT) tablet Take 1,000 Units by mouth daily.    . finasteride (PROSCAR) 5 MG tablet Take 1 tablet by mouth daily. Take 1 tab daily    . Misc Natural Products (BLACK CHERRY CONCENTRATE PO) Take by mouth.    . multivitamin-lutein (OCUVITE-LUTEIN) CAPS capsule Take 1 capsule by mouth daily.    . Omega-3 Fatty Acids (FISH OIL) 1200 MG CAPS Take 1,200 mg by mouth daily.    . tamsulosin (FLOMAX) 0.4 MG CAPS capsule Take 0.8 mg by mouth daily. Take 1Tab daily    . vitamin B-12 (CYANOCOBALAMIN) 1000 MCG tablet Take 1,000 mcg by mouth daily.     No current facility-administered medications for this visit.    Family History  Problem Relation Age of Onset  . Alzheimer's disease Mother   . Pneumonia Mother   . Dementia Father   . Cancer - Colon Sister   . Cancer Sister        pancreatic   There is a family history of dementia. His mother and father both had some sort of dementia, which came on in their 82s. There is no  family history of psychiatric illness.  Psychosocial History  Developmental, Educational and Employment History: The patient is a native of [place] and grew up there. The patient reported a normal childhood development and denied any history of abuse. He did very well in school and was never held back. He went on to earn a bachelor's degree at [employer] and then got an MBA at [employer]. 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   Psychiatric History: Denied by the patient.   Substance  Use History: The patient enjoys drinking wine with dinner, perhaps 2 small drinks per night. He doesn't smoke and has never been a smoker. He doesn't use any illicit drugs.   Relationship History and Living Cimcumstances: The patient and his wife have been married for 58 years, they three children, all of whom live out of town (one is in [place]). His children have not noticed his memory and thinking problems.   Mental Status and Behavioral Observations  Sensorium/Arousal: The patient's level of arousal was awake and alert. Hearing and vision were adequate for testing purposes. Orientation: Fully oriented to person, place, time, well grounded in situation.  Appearance: Dressed in appropriate, casual clothing with reasonable grooming and hygiene Behavior: Pleasant, appropriate, presented as a  good historian Speech/language: Normal in rate, rhythm, and volume. No word finding pauses or paraphasic errors.  Gait/Posture: Not well observed, exam was normal with Dr. Jaynee Eagles Movement: No concerning findings on exam with Dr. Jaynee Eagles Social Comportment: Appropriate, pleasant Mood: Good Affect: Mainly euthymic Thought process/content: Thought process was logical and goal oriented, he was able to provide a fairly detailed history and timeline that presented as accurate when cross referenced with his presentation and objective information. Thought content was pertinent, focused on topics discussed.  Safety: No safety concerns identified in this euthymic patient Insight: Ronald Blevins Cognitive Assessment  01/30/2020  Visuospatial/ Executive (0/5) 4  Naming (0/3) 3  Attention: Read list of digits (0/2) 2  Attention: Read list of letters (0/1) 1  Attention: Serial 7 subtraction starting at 100 (0/3) 3  Language: Repeat phrase (0/2) 2  Language : Fluency (0/1) 1  Abstraction (0/2) 0  Delayed Recall (0/5) 0  Orientation (0/6) 6  Total 22  Adjusted Score (based on education) 22   Test Procedures  Wide  Range Achievement Test - 4             Word Reading Doy Mince' Intellectual Screening Test Neuropsychological Assessment Battery  Memory Module  Naming  Digit Span Repeatable Battery for the Assessment of Neuropsychological Status (Form A)  Figure Copy  Judgment of Line Orientation  Coding  Figure Recall The Dot Counting Test A Random Letter Test Controlled Oral Word Association (F-A-S) Semantic Fluency (Animals) Trail Making Test A & B Complex Ideational Material Modified Wisconsin Card Sorting Test Geriatric Depression Scale - Short Form Quick Dementia Rating System (completed by wife, Margarette)  Plan  Ronald Blevins was seen for a psychiatric diagnostic evaluation and neuropsychological testing. He is a very pleasant, 86 year old, right-hand dominant man with a history of memory and thinking problems over approximately the past year. At present, his difficulties are mild, and involve only day-to-day forgetfulness that is not particularly impactful to his style of life. 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 He is screening in the MCI range today, albeit with 0/5 on delayed recall. Full and complete note with impressions, recommendations, and interpretation of test data to follow.   Ronald Simas Nicole Kindred, PsyD, [place] Clinical Neuropsychologist  Informed Consent and Coding/Compliance  Risks and benefits of the evaluation were discussed with the patient prior to all testing procedures. I conducted a clinical interview and neuropsychological testing (at least two tests) with Lynnell Chad and Milana Kidney, B.S. (Technician) administered additional test procedures. The patient was able to tolerate the testing procedures and the patient (and/or family if applicable) is likely to benefit from further follow up to receive the diagnosis and treatment  recommendations, which will be rendered at the next encounter. Billing below reflects technician time, my direct face-to-face time with the patient, time spent in test administration, and time spent in professional activities including but not limited to: neuropsychological test interpretation, integration of neuropsychological test data with clinical history, report preparation, treatment planning, care coordination, and review of diagnostically pertinent medical history or studies.   Services associated with this encounter: Clinical Interview [phone]) plus 60 minutes (77412; Neuropsychological Evaluation by Professional)  100 minutes (87867; Neuropsychological Evaluation by Professional, Adl.) 20 minutes (67209; Test Administration by Professional) 30 minutes (47096; Neuropsychological Testing by Technician) 95 minutes (28366; Neuropsychological Testing by Technician, Adl.)

## 2020-01-30 NOTE — Progress Notes (Signed)
   Psychometrist Note   Cognitive testing was administered to Ronald Blevins by Milana Kidney, B.S. (Technician) under the supervision of Alphonzo Severance, Psy.D., ABN. Ronald Blevins was able to tolerate all test procedures. Ronald Blevins met with the patient as needed to manage any emotional reactions to the testing procedures. Rest breaks were offered.    The battery of tests administered was selected by Ronald Blevins with consideration to the patient's current level of functioning, the nature of his symptoms, emotional and behavioral responses during the interview, level of literacy, observed level of motivation/effort, and the nature of the referral question. This battery was communicated to the psychometrist. Communication between Ronald Blevins and the psychometrist was ongoing throughout the evaluation and Ronald Blevins was immediately accessible at all times. Ronald Blevins provided supervision to the technician on the date of this service, to the extent necessary to assure the quality of all services provided.    Ronald Blevins will return in approximately one week for an interactive feedback session with Ronald Blevins, at which time test performance, clinical impressions, and treatment recommendations will be reviewed in detail. The patient understands he can contact our office should he require our assistance before this time.   A total of 125 minutes of billable time were spent with Ronald Blevins by the technician, including test administration and scoring time. Billing for these services is reflected in Dr. Les Pou note.   This note reflects time spent with the psychometrician and does not include test scores, clinical history, or any interpretations made by Ronald Blevins. The full report will follow in a separate note.

## 2020-01-31 ENCOUNTER — Telehealth: Payer: Self-pay | Admitting: *Deleted

## 2020-01-31 ENCOUNTER — Encounter: Payer: Self-pay | Admitting: Counselor

## 2020-01-31 NOTE — Telephone Encounter (Signed)
-----   Message from Melvenia Beam, MD sent at 01/29/2020  1:37 PM EDT ----- No significant changes from 2016. He has a benign cyst that has been there and is benign. Some mils age-appropriate changes. This does not rule out demenita, I recommend proceeding to the formal memory testing. Thanks.

## 2020-01-31 NOTE — Progress Notes (Signed)
[facility]  Patient Name: Ronald Blevins MRN: [id number] Date of Birth: 1933/01/01 Age: 86 y.o. Education: 18 years  Measurement properties of test scores: IQ, Index, and Standard Scores (SS): Mean = 100; Standard Deviation = 15 Scaled Scores (Ss): Mean = 10; Standard Deviation = 3 Z scores (Z): Mean = 0; Standard Deviation = 1 T scores (T); Mean = 50; Standard Deviation = 10  TEST SCORES:    Note: This summary of test scores accompanies the interpretive report and should not be interpreted by unqualified individuals or in isolation without reference to the report. Test scores are relative to age, gender, and educational history as available and appropriate.   Performance Validity        "A" Random Letter Test Raw  Descriptor      Errors 0 Within Expectation  The Dot Counting Test: 14 Within Expectation      Mental Status Screening     Total Score Descriptor  MoCA 22 MCI      Expected Functioning        Wide Range Achievement Test: Standard/Scaled Score Percentile      Word Reading 128 97      Reynolds Intellectual Screening Test Standard/T-score Percentile      Guess What 58 79      Odd Item Out 68 96  RIST Index 125 95      Attention/Processing Speed        Neuropsychological Assessment Battery (Attention Module, Form 1): Scaled/T-score Percentile     Digits Forward 38 12     Digits Backwards 34 5      Repeatable Battery for the Assessment of Neuropsychological Status (Form A): Standard Score Percentile     Coding 4 2      Language        Neuropsychological Assessment Battery (Language Module, Form 1): T-score Percentile      Naming   (31) 63 91      Verbal Fluency:  T Score Percentile      Controlled Oral Word Association (F-A-S) 44 27      Semantic Fluency (Animals) 31 3      Memory:        Neuropsychological Assessment Battery (Memory Module, Form 1): T-score/Standard Score Percentile  Memory Index (MEM): 72 3       List Learning           List A Immediate Recall   (4 , 6 , 7) 42 21         List B Immediate Recall   (3) 44 27         List A Short Delayed Recall   (0) 20 <1         List A Long Delayed Recall   (1) 29 2         List A Percent Retention   (0 %) --- <1         List A Long Delayed Yes/No Recognition Hits   (9) --- 14         List A Long Delayed Yes/No Recognition False Alarms   (13) --- 3         List A Recognition Discriminability Index --- <1      Shape Learning           Immediate Recognition   (7 , 3 , 4) 50 50         Delayed Recognition   (4) 44 27  Percent Retention   (100 %) --- 46         Delayed Forced-Choice Recognition Hits   (7) --- 31         Delayed Forced-Choice Recognition False Alarms   (3) --- 31         Delayed Forced-Choice Recognition Discriminability --- 25     Story Learning           Immediate Recall   (20, 25) 37 9         Delayed Recall   (18) 34 5         Percent Retention   (72 %) --- 34      Daily Living Memory            Immediate Recall   (25, 13) 45 31          Delayed Recall   (4, 1) 24 <1          Percent Retention (31 %) --- 1          Recognition Hits   (4) --- <1      Repeatable Battery for the Assessment of Neuropsychological Status (Form A): Scaled Score Percentile         Figure Recall   (0) 1 <1      Visuospatial/Constructional Functioning        Repeatable Battery for the Assessment of Neuropsychological Status (Form A): Standard/Scaled Score Percentile      Visuospatial/Constructional Index 116 86         Figure Copy   (19) 12 75         Judgment of Line Orientation   (18) --- >75      Executive Functioning        Modified [place] Card Sorting Test (MWCST): Standard/T-Score Percentile      Number of Categories Correct 60 84      Number of Perseverative Errors 69 97      Number of Total Errors 55 69      Percent Perseverative Errors 67 96  Executive Function Composite 124 95      Trail Making Test: T-Score Percentile        Part A 49 46      Part B 45 31      Boston Diagnostic Aphasia Exam: Raw Score Scaled Score      Complex Ideational Material 10 7      Clock Drawing Raw Score Descriptor      Command 8 Borderline Impairment      Rating Scales        Clinical Dementia Rating Raw Score Descriptor      Sum of Boxes 1.5 MCI      Global Score 0.5 MCI      Quick Dementia Rating System Raw Score Descriptor      Sum of Boxes 1 MCI      Total Score 1.5 MCI  Geriatric Depression Scale - Short Form 3 Negative   Jason Frisbee V. Nicole Kindred PsyD, [place] Clinical Neuropsychologist

## 2020-01-31 NOTE — Telephone Encounter (Signed)
Spoke with pt and discussed MRI brain results per Dr. Jaynee Eagles. Pt verbalized understanding. His questions were answered. Pt had neuropsych testing done yesterday and meets with Dr Nicole Kindred to discuss results on 02/09/20. Pt has requested that our office mail him the MRI results. I let him know the request would be sent to our medical records department.

## 2020-02-02 ENCOUNTER — Telehealth: Payer: Self-pay | Admitting: *Deleted

## 2020-02-02 ENCOUNTER — Telehealth: Payer: Self-pay | Admitting: Neurology

## 2020-02-02 NOTE — Progress Notes (Signed)
[facility]  Patient Name: Rani Sisney MRN: [id number] Date of Birth: 05/06/1932 Age: 86 y.o. Education: 18 years  Clinical Impressions  Jeffre Enriques is a 86 y.o., right-hand dominant, married man with a history of HLD, BPH, gout, and problems with memory and thinking over the past year. He was referred by Dr. Jaynee Eagles at [facility] for evaluation in the service of diagnostic clarity. His day-to-day issues involve mainly forgetfulness, that may slightly interfere with his functioning, but he is still fully independent and doing well for the most part. He has an MRI that shows mild to moderate atrophic changes, I concur with Dr. Jaynee Eagles that there is a bit of temporal (and also parietal) predominance. There is a mild burden of leukoaraiosis, perhaps enough to contribute but not enough to cause a dementia level of function.   On neuropsychological assessment, Mr. Mowers demonstrated superior levels of intellectual ability and overall memory falling well below that with an unusually low score on the NAB memory index. The profile is concerning for a developing storage problem. He also had extremely low performance on a measure of processing speed, mildly diminished performance on measures of attention and working memory, and decreased semantic as compared to phonemic fluency. Executive abilities, visuospatial function, and naming present as well preserved. He screened negative for the presence of clinically significant levels of depressive symptoms and was characterized as functioning at an MCI level by his wife, which is concordant with his CDR rating.   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 His issues are quite mild at present and he presented as motivated regarding healthy lifestyle changes, which is prognostically positive. His sleep issues and mild vascular changes could also be contributing but are unlikely primary  causes. Would consider starting an antidementia medication. I will counsel him and his wife on healthy lifestyle changes such as MIND diet and exercise. I have no concerns about his driving at this point in time but would suggest that he avoid driving in unfamiliar areas to the extent possible and use a GPS when appropriate. Follow up assessment in 1 to 2 years could be considered as clinically indicated.   Diagnostic Impressions: Amnestic mild cognitive impairment  Recommendations to be discussed with patient  Your performance and presentation on assessment suggested very good levels of overall cognitive ability, which might be expected given your relatively high level of educational and occupational achievement. You did well in many areas, but did have memory test performance that almost certainly represents a decline for you. The profile is concerning for an emerging storage problem. You are still functioning well, making the best diagnosis Mild Cognitive Impairment (MCI).   The major difference between mild cognitive impairment (MCI) and dementia is in severity and potential prognosis. Once someone reaches a level of severity adequate to be diagnosed with a dementia, there is usually progression over time, though this may be years. On the other hand, mild cognitive impairment, while a significant risk for dementia in future, does not always progress to dementia, and in some instances stays the same or can even revert to normal. It is important to realize that if MCI is due to underlying Alzheimer's disease, it will most likely progress to dementia eventually. The rate of conversion to Alzheimer's dementia from amnestic MCI is about 15% per year versus the general population risk of conversion of 2% per year.   I am concerned that your mild cognitive impairment is likely  due to the most common cause of age-related cognitive decline, which is Alzheimer's disease. If that is the case, then there will be  progression over time. Positively, your difficulties are quite mild at this point in time and now is an excellent time to begin healthy lifestyle changes that have been shown to promote cognitive longevity.   You can discuss memory medications with Dr. Jaynee Eagles, these are typically associated with mild benefit and do not alter the disease process, although they are associated with maintenance of function and/or measurable improvements and are usually well tolerated.   There is now good quality evidence from at least one large scale study that a modified mediterranean diet may help slow cognitive decline. This is known as the "MIND" diet. The Mind diet is not so much a specific diet as it is a set of recommendations for things that you should and should not eat.   Foods that are ENCOURAGED on the MIND Diet:  Green, leafy vegetables: Aim for six or more servings per week. This includes kale, spinach, cooked greens and salads.  All other vegetables: Try to eat another vegetable in addition to the green leafy vegetables at least once a day. It is best to choose non-starchy vegetables because they have a lot of nutrients with a low number of calories.  Berries: Eat berries at least twice a week. There is a plethora of research on strawberries, and other berries such as blueberries, raspberries and blackberries have also been found to have antioxidant and brain health benefits.  Nuts: Try to get five servings of nuts or more each week. The creators of the [place] don't specify what kind of nuts to consume, but it is probably best to vary the type of nuts you eat to obtain a variety of nutrients. Peanuts are a legume and do not fall into this category.  Olive oil: Use olive oil as your main cooking oil. There may be other heart-healthy alternatives such as algae oil, though there is not yet sufficient research upon which to base a formal recommendation.  Whole grains: Aim for at least three servings daily.  Choose minimally processed grains like oatmeal, quinoa, brown rice, whole-wheat pasta and 100% whole-wheat bread.  Fish: Eat fish at least once a week. It is best to choose fatty fish like salmon, sardines, trout, tuna and mackerel for their high amounts of omega-3 fatty acids.  Beans: Include beans in at least four meals every week. This includes all beans, lentils and soybeans.  Poultry: Try to eat chicken or [place] at least twice a week. Note that fried chicken is not encouraged on the MIND diet.  Wine: Aim for no more than one glass of alcohol daily. Both red and white wine may benefit the brain. However, much research has focused on the red wine compound resveratrol, which may help protect against Alzheimer's disease.  Foods that are DISCOURAGED on the MIND Diet: Butter and margarine: Try to eat less than 1 tablespoon (about 14 grams) daily. Instead, try using olive oil as your primary cooking fat, and dipping your bread in olive oil with herbs.  Cheese: The MIND diet recommends limiting your cheese consumption to less than once per week.  Red meat: Aim for no more than three servings each week. This includes all beef, pork, lamb and products made from these meats.  Maceo Pro food: The MIND diet highly discourages fried food, especially the kind from fast-food restaurants. Limit your consumption to less than once per week.  Pastries and sweets: This includes most of the processed junk food and desserts you can think of. Ice cream, cookies, brownies, snack cakes, donuts, candy and more. Try to limit these to no more than four times a week.  Exercise is one of the best medicines for promoting health and maintaining cognitive fitness at all stages in life. Exercise probably has the largest documented effect on brain health and performance of any lifestyle intervention. Studies have shown that even previously sedentary individuals who start exercising as late as age 45 show a significant survival benefit  as compared to their non-exercising peers. In the [place], the current guidelines are for 30 minutes of moderate exercise per day, but increasing your activity level less than that may also be helpful. You do not have to get your 30 minutes of exercise in one shot and exercising for short periods of time spread throughout the day can be helpful. Go for several walks, learn to dance, or do something else you enjoy that gets your body moving. Of course, if you have an underlying medical condition or there is any question about whether it is safe for you to exercise, you should consult a medical treatment provider prior to beginning exercise.   With regards to driving, I have no concerns. Because you reported that you are not as good with directions as in the past, I would suggest that you avoid driving in unfamiliar areas to the extent possible and that you use a GPS when appropriate or bring a family member with you.   The Alzheimer's association is a Pensions consultant for individuals with Alzheimer's disease and related disorders (ADRD). They offer a number of different services including support groups, a 24/7 crisis line, useful information, and activities such as their walk to end Alzheimer's to raise awareness about the disease. Many people benefit greatly from interacting with, learning from, and sharing information with others who are going through the same experience. Their website [employer]co[employer]nz is a great place to start. If you are interested, I can make a referral for a social worker from the Alzheimer's association to contact you.  Follow up evaluation in 1 to 2 years, as clinically indicated and desired by the referring provider, could be considered to monitor your progress over time.   Test Findings  Test scores are summarized in additional documentation associated with this encounter. Test scores are relative to age, gender, and educational history as available and appropriate.  There were no concerns about performance validity as all findings fell within normal expectations.   General Intellectual Functioning/Achievement:  Performance was very good, in the superior range on single word reading. Mr. Brammer demonstrated similar superior performance on the RIST index, with high average performance on the more verbally oriented subtest and superior performance on the more visually oriented subtest. These findings suggest a stringent standard of comparison for his cognitive test scores.   Attention and Processing Efficiency: Performance on indicators of attention was weak with low average (nearly unusually low) digit repetition forward and unusually low digit repetition backward.   With respect to processing efficiency, performance was extremely low on timed number-symbol coding. He did better, within the average range, on simple numeric sequencing (but that is a very easy task).   Language: Performance on language measures was mixed with good fundamental abilities (I.e., naming), average phonemic fluency, and unusually low animal fluency.   Visuospatial Function: Performance was within normal limits in this domain with a high average overall score. He  performed nearly errorlessly on copy of a modestly complex geometric figure, generating an average score. Judgment of angular line orientations was high average.   Learning and Memory: Mr. Macera performance in this domain was markedly below expectations, falling at an unusually low level on the overall index. The profile is concerning for a developing storage problem but he did retain some information across time.   In the verbal realm, immediate recall for material including a 12-item word list, short story, and brief daily living type information was low average to average. There was notable forgetting over time, however, particularly for unstructured information. Delayed recall for the word list was extremely low at 1  word, delayed recall for the short story was unusually low, and delayed recall for the brief daily living information was extremely low. Recognition cuing did not help his performance with respect to the word list, with more false positives than correct identifications and an extremely low discriminability index. Delayed recognition of the daily-living information was extremely low.   In the visual realm, immediate recall of a series of designs that is difficult to verbally encode was average and he retained the information well with average delayed recall. He performed at an average level using a yes/no recognition format. His delayed free recall for a modestly complex geometric figure was not as robust, he did not remember any figure details, generating an extremely low score.   Executive Functions: Performance was well preserved in this domain with superior performance on the [employer] of a rule-based categorization procedure involving card sorting. Alternating sequencing of numbers and letters of the alphabet was average. Reasoning with complex verbal information was low average, which may represent a decline for him because this is a fairly easy task. Clock drawing was suggestive of "borderline impairment," with hands markedly out of course.   Rating Scale(s): Mr. Jaskiewicz screened negative for the presence of depression on self-rating of symptoms. His wife characterized him as functioning at an MCI level, which is concordant with his CDR rating, which is a 0.5 for the global score, a 1.5 on the Sum of Boxes.   Viviano Simas Nicole Kindred PsyD, [place] Clinical Neuropsychologist

## 2020-02-02 NOTE — Telephone Encounter (Signed)
Done

## 2020-02-02 NOTE — Telephone Encounter (Signed)
Ronald Blevins, looks like patient had his formal neuropsych testing. Would you please see if he would like to come for a follow up in about 4 months? If he feels the neuropsych testing was enough he doesn't have to but we could continue to follow with him.He will be meeting with Dr. Nicole Kindred to review the results of the formal testing (if he hasn't already).  If he wants to continue to follow with [place], Amy or Jinny Blossom would be fine.

## 2020-02-09 ENCOUNTER — Ambulatory Visit (INDEPENDENT_AMBULATORY_CARE_PROVIDER_SITE_OTHER): Payer: Medicare Other | Admitting: Counselor

## 2020-02-09 ENCOUNTER — Encounter: Payer: Self-pay | Admitting: Counselor

## 2020-02-09 ENCOUNTER — Other Ambulatory Visit: Payer: Self-pay

## 2020-02-09 DIAGNOSIS — G3184 Mild cognitive impairment, so stated: Secondary | ICD-10-CM

## 2020-02-09 NOTE — Progress Notes (Signed)
   [facility]  Feedback Note: I met with Lynnell Chad to review the findings resulting from his neuropsychological evaluation. Since the last appointment, he has been about the same. Time was spent reviewing the impressions and recommendations that are detailed in the evaluation report. We discussed impression of amnestic mild cognitive impairment, which at his age is likely related to AD. I frame MCI as another chronic health condition to manage, with lifestyle changes and perhaps also medication, which can be discussed with Dr. Jaynee Eagles. I took time to explain the findings and answer all the patient's questions. I encouraged Mr. Grasse to contact me should he have any further questions or if further follow up is desired.   Current Medications and Medical History   Current Outpatient Medications  Medication Sig Dispense Refill  . Ascorbic Acid (VITAMIN C PO) Take 500 mg by mouth daily.     Marland Kitchen aspirin 81 MG chewable tablet Chew 1 tablet (81 mg total) by mouth daily. 30 tablet 0  . cholecalciferol (VITAMIN D3) 25 MCG (1000 UNIT) tablet Take 1,000 Units by mouth daily.    . finasteride (PROSCAR) 5 MG tablet Take 1 tablet by mouth daily. Take 1 tab daily    . Misc Natural Products (BLACK CHERRY CONCENTRATE PO) Take by mouth.    . multivitamin-lutein (OCUVITE-LUTEIN) CAPS capsule Take 1 capsule by mouth daily.    . Omega-3 Fatty Acids (FISH OIL) 1200 MG CAPS Take 1,200 mg by mouth daily.    . tamsulosin (FLOMAX) 0.4 MG CAPS capsule Take 0.8 mg by mouth daily. Take 1Tab daily    . vitamin B-12 (CYANOCOBALAMIN) 1000 MCG tablet Take 1,000 mcg by mouth daily.     No current facility-administered medications for this visit.    Patient Active Problem List   Diagnosis Date Noted  . Mild memory disturbance 01/24/2020  . White coat hypertension 03/09/2015  . TIA (transient ischemic attack) 01/08/2015  . Orthostatic hypotension 01/08/2015  . Hypercholesterolemia     . Colon polyp   . PSA elevation   . Dizziness   . History of colon polyps   . Chest pain   . Dyslipidemia     Mental Status and Behavioral Observations  Sebastain Fishbaugh presented on time to the present encounter and was alert and generally oriented. Speech was normal in rate, rhythm, volume, and prosody. Self-reported mood was "allright" and affect was mainly euthymic. Thought process was logical and goal oriented and thought content was appropriate. There were no safety concerns identified at today's encounter, such as thoughts of harming self or others.   Plan  Feedback provided regarding the patient's neuropsychological evaluation. He has amnestic MCI, but overall is doing quite well for his age. He is motivated to engage in lifestyle changes, which were discussed, and he can follow up with Dr. Jaynee Eagles for medication. Miriam Kestler was encouraged to contact me if any questions arise or if further follow up is desired.   Viviano Simas Nicole Kindred, PsyD, [place] Clinical Neuropsychologist  Service(s) Provided at This Encounter: 44 minutes [phone]; Conjoint therapy with patient present)

## 2020-02-09 NOTE — Patient Instructions (Signed)
Your performance and presentation on assessment suggested very good levels of overall cognitive ability, which might be expected given your relatively high level of educational and occupational achievement. You did well in many areas, but did have memory test performance that almost certainly represents a decline for you. The profile is concerning for an emerging storage problem. You are still functioning well, making the best diagnosis Mild Cognitive Impairment (MCI).   The major difference between mild cognitive impairment (MCI) and dementia is in severity and potential prognosis. Once someone reaches a level of severity adequate to be diagnosed with a dementia, there is usually progression over time, though this may be years. On the other hand, mild cognitive impairment, while a significant risk for dementia in future, does not always progress to dementia, and in some instances stays the same or can even revert to normal.It is important to realize that if MCI is due to underlying Alzheimer's disease, it will most likely progress to dementia eventually. The rate of conversion to Alzheimer's dementiafrom amnestic MCI is about 15% per year versus the general population risk of conversion of 2% per year.  I am concerned that your mild cognitive impairment is likely due to the most common cause of age-related cognitive decline, which is Alzheimer's disease. If that is the case, then there will be progression over time. Positively, your difficulties are quite mild at this point in time and now is an excellent time to begin healthy lifestyle changes that have been shown to promote cognitive longevity.   You can discuss memory medications with Dr. Jaynee Eagles, these are typically associated with mild benefit and do not alter the disease process, although they are associated with maintenance of function and/or measurable improvements and are usually well tolerated.   There is now good quality evidence from at  least one large scale study that a modified mediterranean diet may help slow cognitive decline. This is known as the "MIND" diet. The Mind diet is not so much a specific diet as it is a set of recommendations for things that you should and should not eat.   Foods that are ENCOURAGED on the MIND Diet:  Green, leafy vegetables: Aim for six or more servings per week. This includes kale, spinach, cooked greens and salads.  All other vegetables: Try to eat another vegetable in addition to the green leafy vegetables at least once a day. It is best to choose non-starchy vegetables because they have a lot of nutrients with a low number of calories.  Berries: Eat berries at least twice a week. There is a plethora of research on strawberries, and other berries such as blueberries, raspberries and blackberries have also been found to have antioxidant and brain health benefits.  Nuts: Try to get five servings of nuts or more each week. The creators of the [place] don't specify what kind of nuts to consume, but it is probably best to vary the type of nuts you eat to obtain a variety of nutrients. Peanuts are a legume and do not fall into this category.  Olive oil: Use olive oil as your main cooking oil. There may be other heart-healthy alternatives such as algae oil, though there is not yet sufficient research upon which to base a formal recommendation.  Whole grains: Aim for at least three servings daily. Choose minimally processed grains like oatmeal, quinoa, brown rice, whole-wheat pasta and 100% whole-wheat bread.  Fish: Eat fish at least once a week. It is best to choose fatty fish  like salmon, sardines, trout, tuna and mackerel for their high amounts of omega-3 fatty acids.  Beans: Include beans in at least four meals every week. This includes all beans, lentils and soybeans.  Poultry: Try to eat chicken or [place] at least twice a week. Note that fried chicken is not encouraged on the MIND diet.  Wine: Aim  for no more than one glass of alcohol daily. Both red and white wine may benefit the brain. However, much research has focused on the red wine compound resveratrol, which may help protect against Alzheimer's disease.  Foods that are DISCOURAGED on the MIND Diet: Butter and margarine: Try to eat less than 1 tablespoon (about 14 grams) daily. Instead, try using olive oil as your primary cooking fat, and dipping your bread in olive oil with herbs.  Cheese: The MIND diet recommends limiting your cheese consumption to less than once per week.  Red meat: Aim for no more than three servings each week. This includes all beef, pork, lamb and products made from these meats.  Maceo Pro food: The MIND diet highly discourages fried food, especially the kind from fast-food restaurants. Limit your consumption to less than once per week.  Pastries and sweets: This includes most of the processed junk food and desserts you can think of. Ice cream, cookies, brownies, snack cakes, donuts, candy and more. Try to limit these to no more than four times a week.  Exercise is one of the best medicines for promoting health and maintaining cognitive fitness at all stages in life. Exercise probably has the largest documented effect on brain health and performance of any lifestyle intervention. Studies have shown that even previously sedentary individuals who start exercising as late as age 27 show a significant survival benefit as compared to their non-exercising peers. In the [place], the current guidelines are for 30 minutes of moderate exercise per day, but increasing your activity level less than that may also be helpful. You do not have to get your 30 minutes of exercise in one shot and exercising for short periods of time spread throughout the day can be helpful. Go for several walks, learn to dance, or do something else you enjoy that gets your body moving. Of course, if you have an underlying medical condition or there is  any question about whether it is safe for you to exercise, you should consult a medical treatment provider prior to beginning exercise.   With regards to driving, I have no concerns. Because you reported that you are not as good with directions as in the past, I would suggest that you avoid driving in unfamiliar areas to the extent possible and that you use a GPS when appropriate or bring a family member with you.   The Alzheimer's association is a Pensions consultant for individuals with Alzheimer's disease and related disorders (ADRD). They offer a number of different services including support groups, a 24/7 crisis line, useful information, and activities such as their walk to end Alzheimer's to raise awareness about the disease. Many people benefit greatly from interacting with, learning from, and sharing information with others who are going through the same experience. Their website [employer]nz is a great place to start.   Follow up evaluation in 1 to 2 years, as clinically indicated and desired by the referring provider, could be considered to monitor your progress over time.

## 2020-02-20 ENCOUNTER — Ambulatory Visit: Payer: Medicare Other | Admitting: Adult Health

## 2020-02-27 ENCOUNTER — Institutional Professional Consult (permissible substitution): Payer: Medicare Other | Admitting: Neurology

## 2020-03-14 ENCOUNTER — Other Ambulatory Visit: Payer: Self-pay | Admitting: Urology

## 2020-03-14 DIAGNOSIS — R972 Elevated prostate specific antigen [PSA]: Secondary | ICD-10-CM

## 2020-04-07 ENCOUNTER — Ambulatory Visit
Admission: RE | Admit: 2020-04-07 | Discharge: 2020-04-07 | Disposition: A | Discharge status: Home / Self Care | Payer: Medicare Other | Source: Ambulatory Visit | Attending: Urology | Admitting: Urology

## 2020-04-07 DIAGNOSIS — R972 Elevated prostate specific antigen [PSA]: Secondary | ICD-10-CM

## 2020-04-07 MED ORDER — GADOBENATE DIMEGLUMINE 529 MG/ML IV SOLN
16.0000 mL | Freq: Once | INTRAVENOUS | Status: AC | PRN
  Administered 2020-04-07: 16 mL via INTRAVENOUS

## 2020-06-07 ENCOUNTER — Ambulatory Visit: Payer: Medicare Other | Admitting: Adult Health

## 2020-06-20 DIAGNOSIS — L821 Other seborrheic keratosis: Secondary | ICD-10-CM | POA: Diagnosis not present

## 2020-06-20 DIAGNOSIS — L280 Lichen simplex chronicus: Secondary | ICD-10-CM | POA: Diagnosis not present

## 2020-06-20 DIAGNOSIS — L570 Actinic keratosis: Secondary | ICD-10-CM | POA: Diagnosis not present

## 2020-06-20 DIAGNOSIS — Z85828 Personal history of other malignant neoplasm of skin: Secondary | ICD-10-CM | POA: Diagnosis not present

## 2020-09-13 DIAGNOSIS — R202 Paresthesia of skin: Secondary | ICD-10-CM | POA: Diagnosis not present

## 2020-09-13 DIAGNOSIS — R413 Other amnesia: Secondary | ICD-10-CM | POA: Diagnosis not present

## 2020-10-02 ENCOUNTER — Encounter: Payer: Self-pay | Admitting: Neurology

## 2020-10-03 DIAGNOSIS — G4733 Obstructive sleep apnea (adult) (pediatric): Secondary | ICD-10-CM | POA: Diagnosis not present

## 2020-12-12 ENCOUNTER — Encounter: Payer: Self-pay | Admitting: Neurology

## 2020-12-12 ENCOUNTER — Other Ambulatory Visit: Payer: Self-pay

## 2020-12-12 ENCOUNTER — Ambulatory Visit: Payer: Medicare Other | Admitting: Neurology

## 2020-12-12 VITALS — BP 137/79 | HR 68 | Ht 71.0 in | Wt 180.0 lb

## 2020-12-12 DIAGNOSIS — G3184 Mild cognitive impairment, so stated: Secondary | ICD-10-CM

## 2020-12-12 DIAGNOSIS — Z131 Encounter for screening for diabetes mellitus: Secondary | ICD-10-CM | POA: Diagnosis not present

## 2020-12-12 DIAGNOSIS — M109 Gout, unspecified: Secondary | ICD-10-CM | POA: Diagnosis not present

## 2020-12-12 DIAGNOSIS — E7800 Pure hypercholesterolemia, unspecified: Secondary | ICD-10-CM | POA: Diagnosis not present

## 2020-12-12 MED ORDER — DONEPEZIL HCL 5 MG PO TABS: 5.0000 mg | ORAL_TABLET | Freq: Every day | ORAL | 3 refills | Status: DC

## 2020-12-12 NOTE — Progress Notes (Signed)
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 Dr. Nicole Kindred will counsel him on preventative strategies, could consider cholinesterase inhibitor as deemed appropriate.   He says he is feeling fine. He has a daughter who is helping him learn how to use GPS on a cell phone, In august he trimmed the bushes in front of the house, gardeing and active, sees his daughters one in [place] and one in [place], they drive to [place], no falls, appetite is fine, not losing weight, no accidents, daughter looks after. Wife sees that he is not remembering as much, slow gradual decline. Wife says he is a good driver we discussed a driving test and he declined, we discussed accidents. Wife would like him to think about it. Talk to daughter. Son is POA and HCPOA. Son has access to all accounts and they watch for scam artists. They had a call from someone who said he was their grandson and knew it was a scam. They do not feel they need to be re-tested. We discussed.   Personally reviewed MRI brain and agree: 01/2020: IMPRESSION: Abnormal MRI scan of the brain with and without contrast showing a benign large arachnoid cyst in the anterior middle cranial fossa on the right.  Mild age-appropriate changes of chronic small vessel disease and generalized cerebral atrophy are noted.  No significant change compared with CT from 2016 except mild expected progression of atrophy.    HPI:  Ronald Blevins is a 87 y.o. male here as requested by Lawerance Cruel, MD for "getting lost at times". PMHx pure hypercholesterolemia, BPH, memory deficit, gout, sinusitis. I reviewed Dr. Alan Ripper notes: He does take a vitamin B complex daily, aspirin, I reviewed notes from Dr. Harrington Challenger: I  not see any history of his memory changes or complaints in the notes that I received. I do see that vitamin B12 was ordered on December 12, 2019 and returned with a value of 300, all it says that patient is getting lost at times and was referred here for evaluation. I reviewed Epic and Care Everywhere and I don't see any other notes discussing any memory concerns either.   He is concerned about his memory. He has gotten lost, started about a year ago, started forgetting people's names and how to get places he has been to. He gets to places and has forgotten how to get home, he can get back and forth eventually. He is still social with family but Covid has made golfing difficult. He keeps bisy at home, fixing things around the house, he takes care of the plants, he is active. He has designed his own complicated wine rack, no accidents with driving, no accidents at home. He manages the finances, still doing well balancing the checkbooks, not confused with dates, he misplaces his glasses "always looking for my glasses". 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 Wife is here and provides info, she says he sometimes repeats things about where they are going but he has never paid attention (they laugh), he may ask where they are going multiple times in the same day, his driving is very good and his reflexes are good. It's really mostly being  able to remember a route, he is aware with the landmarks but he forgets. [place] would take a circuitous route instead of the direct route he should know to take. Mother had Alzheimers, father had undiagnosed dementia in their 43s-80s. His brother passed away 8 years ago at the age of 71 without anymemory issues, other brother dies at 23. He snores, he kicks all night.   Reviewed notes, labs and imaging from outside physicians, which showed:  CT of the head in 2016 was completed for  dizziness, personally reviewed images and agree with the following:Diffuse cerebral atrophy. Low-attenuation changes in the deep white matter consistent small vessel ischemia. Large CSF space in the right middle cranial fossa probably representing arachnoid cyst. No mass effect or midline shift. Gray-white matter junctions are distinct. Basal cisterns are not effaced. No acute intracranial hemorrhage. Visualized paranasal sinuses and mastoid air cells are not opacified. No depressed skull fractures.   IMPRESSION: CSF space in the right middle cranial fossa probably representing an arachnoid cyst. No acute intracranial abnormalities. Chronic atrophy and small vessel ischemic changes. I would also add is difficult to say with the extent of the white matter changes are on CAT scan and the cerebral atrophy does appear mildly more pronounced in the mesial temporal lobes.    Review of Systems: Patient complains of symptoms per HPI as well as the following symptoms: memory loss . Pertinent negatives and positives per HPI. All others negative    Social History   Socioeconomic History   Marital status: Married    Spouse name: Not on file   Number of children: 3   Years of education: Not on file   Highest education level: Master's degree (e.g., MA, MS, MEng, MEd, MSW, MBA)  Occupational History   Not on file  Tobacco Use   Smoking status: Former    Types: Cigars   Smokeless tobacco: Never   Tobacco comments:    once in awhile a cigar (update 12/12/2020 none in over 1 yr)  Vaping Use   Vaping Use: Never used  Substance and Sexual Activity   Alcohol use: Yes    Comment: 8 oz wine with dinner most nights   Drug use: No   Sexual activity: Not on file    Comment: passive smoke from cigars  Other Topics Concern   Not on file  Social History Narrative   Lives at home with wife   Right handed   Caffeine: 3 cups/day   Social Determinants of Health   Financial Resource Strain: Not on  file  Food Insecurity: Not on file  Transportation Needs: Not on file  Physical Activity: Not on file  Stress: Not on file  Social Connections: Not on file  Intimate Partner Violence: Not on file    Family History  Problem Relation Age of Onset   Alzheimer's disease Mother    Pneumonia Mother    Dementia Father    Cancer - Colon Sister    Cancer Sister        pancreatic    Past Medical History:  Diagnosis Date   BPH (benign prostatic hyperplasia)    per notes from Dr Lona Kettle   Chest pain    Colon polyp    Dizziness    Dyslipidemia    Gout, unspecified    per notes from Dr Lona Kettle   History of colon polyps    Hypercholesterolemia    PSA elevation  White coat hypertension 03/09/2015    Patient Active Problem List   Diagnosis Date Noted   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   Mild memory disturbance 01/24/2020   White coat hypertension 03/09/2015   TIA (transient ischemic attack) 01/08/2015   Orthostatic hypotension 01/08/2015   Hypercholesterolemia    Colon polyp    PSA elevation    Dizziness    History of colon polyps    Chest pain    Dyslipidemia     Past Surgical History:  Procedure Laterality Date   CARDIAC CATHETERIZATION     CATARACT EXTRACTION, BILATERAL Bilateral    COLONOSCOPY     TONSILLECTOMY      Current Outpatient Medications  Medication Sig Dispense Refill   Ascorbic Acid (VITAMIN C PO) Take 500 mg by mouth daily.      aspirin 81 MG chewable tablet Chew 1 tablet (81 mg total) by mouth daily. 30 tablet 0   cholecalciferol (VITAMIN D3) 25 MCG (1000 UNIT) tablet Take 1,000 Units by mouth daily.     donepezil (ARICEPT) 5 MG tablet Take 1 tablet (5 mg total) by mouth at bedtime. 30 tablet 3   finasteride (PROSCAR) 5 MG tablet Take 1 tablet by mouth daily. Take 1 tab daily     Misc Natural Products (BLACK CHERRY CONCENTRATE PO) Take by mouth.     multivitamin-lutein (OCUVITE-LUTEIN) CAPS capsule Take  1 capsule by mouth daily.     Omega-3 Fatty Acids (FISH OIL) 1200 MG CAPS Take 1,200 mg by mouth daily.     tamsulosin (FLOMAX) 0.4 MG CAPS capsule Take 0.8 mg by mouth daily. Take 1Tab daily     vitamin B-12 (CYANOCOBALAMIN) 1000 MCG tablet Take 1,000 mcg by mouth daily.     No current facility-administered medications for this visit.    Allergies as of 12/12/2020   (No Known Allergies)    Vitals: BP 137/79 (BP Location: Right Arm, Patient Position: Sitting)   Pulse 68   Ht '5\' 11"'$  (1.803 m)   Wt 180 lb (81.6 kg)   BMI 25.10 kg/m  Last Weight:  Wt Readings from Last 1 Encounters:  12/12/20 180 lb (81.6 kg)   Last Height:   Ht Readings from Last 1 Encounters:  12/12/20 '5\' 11"'$  (1.803 m)     Physical exam: Exam: Gen: NAD, conversant, well nourised, well groomed                     CV: RRR, no MRG. No Carotid Bruits. No peripheral edema, warm, nontender Eyes: Conjunctivae clear without exudates or hemorrhage  Neuro: Detailed Neurologic Exam  Speech:    Speech is normal; fluent and spontaneous with normal comprehension.  Cognition:  MMSE - Mini Mental State Exam 12/12/2020 01/24/2020  Orientation to time 4 5  Orientation to Place 4 2  Registration 3 3  Attention/ Calculation 3 4  Recall 2 3  Language- name 2 objects 2 2  Language- repeat 1 1  Language- follow 3 step command 3 3  Language- read & follow direction 1 1  Write a sentence 1 1  Copy design 0 1  Total score 24 26       The patient is oriented to person, place, and time;     recent and remote memory intact;     language fluent;     normal attention, concentration,     fund of knowledge Cranial Nerves:    The pupils are equal, round,  and reactive to light. Attempted, pupils too small to visualize fundi. Visual fields are full to finger confrontation. Extraocular movements are intact. Trigeminal sensation is intact and the muscles of mastication are normal. The face is symmetric. The palate elevates in  the midline. Hearing intact. Voice is normal. Shoulder shrug is normal. The tongue has normal motion without fasciculations.   Coordination:    No dysmetria/ataxia  Gait:    Heel-toe and tandem gait are normal.   Motor Observation:    No asymmetry, no atrophy, and no involuntary movements noted. Tone:    Normal muscle tone.    Posture:    Posture is normal. normal erect    Strength:    Strength is V/V in the upper and lower limbs.      Sensation: intact to LT     Reflex Exam:  DTR's:    Deep tendon reflexes in the upper and lower extremities are brisk bilaterally.   Toes:    The toes are downgoing bilaterally.   Clonus:    Clonus is absent.    Assessment/Plan:  [age] year old with a FHx dementia here for amnestic MCI just affecting memory. 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  Start Donepezil: Will call in 1 month and if you are doing well will increase to '10mg'$  Donepezil/Aricept  They don't feel they need re-testing, MMSE from 26 to 24. Clinically monitor  Discussed driving, recommended driving test, also only drive during day to familiar close places  Gave dementia packet/information/resources online and in the community  Meds ordered this encounter  Medications   donepezil (ARICEPT) 5 MG tablet    Sig: Take 1 tablet (5 mg total) by mouth at bedtime.    Dispense:  30 tablet    Refill:  3      Cc: Lawerance Cruel, MD,  Lawerance Cruel, MD  Sarina Ill, MD  [facility] [address] [place], [place] [address]  Phone [phone] Fax [phone]  I spent over 30 minutes of face-to-face and non-face-to-face time with patient on the  1. Amnestic MCI (mild cognitive impairment with memory loss)    diagnosis.  This included previsit chart review, lab review, study review, order entry, electronic health record documentation, patient education on the different diagnostic and therapeutic  options, counseling and coordination of care, risks and benefits of management, compliance, or risk factor reduction

## 2020-12-12 NOTE — Patient Instructions (Signed)
Will call in 1 month and if you are doing well will increase to '10mg'$  Donepezil/Aricept  Donepezil tablets What is this medication? DONEPEZIL (doe NEP e zil) is used to treat mild to moderate dementia caused byAlzheimer's disease. This medicine may be used for other purposes; ask your health care provider orpharmacist if you have questions. COMMON BRAND NAME(S): Aricept What should I tell my care team before I take this medication? They need to know if you have any of these conditions: asthma or other lung disease difficulty passing urine head injury heart disease history of irregular heartbeat liver disease seizures (convulsions) stomach or intestinal disease, ulcers or stomach bleeding an unusual or allergic reaction to donepezil, other medicines, foods, dyes, or preservatives pregnant or trying to get pregnant breast-feeding How should I use this medication? Take this medicine by mouth with a glass of water. Follow the directions on the prescription label. You may take this medicine with or without food. Take this medicine at regular intervals. This medicine is usually taken before bedtime. Do not take it more often than directed. Continue to take your medicine even ifyou feel better. Do not stop taking except on your doctor's advice. If you are taking the 23 mg donepezil tablet, swallow it whole; do not cut,crush, or chew it. Talk to your pediatrician regarding the use of this medicine in children.Special care may be needed. Overdosage: If you think you have taken too much of this medicine contact apoison control center or emergency room at once. NOTE: This medicine is only for you. Do not share this medicine with others. What if I miss a dose? If you miss a dose, take it as soon as you can. If it is almost time for yournext dose, take only that dose, do not take double or extra doses. What may interact with this medication? Do not take this medicine with any of the following  medications: certain medicines for fungal infections like itraconazole, fluconazole, posaconazole, and voriconazole cisapride dextromethorphan; quinidine dronedarone pimozide quinidine thioridazine This medicine may also interact with the following medications: antihistamines for allergy, cough and cold atropine bethanechol carbamazepine certain medicines for bladder problems like oxybutynin, tolterodine certain medicines for Parkinson's disease like benztropine, trihexyphenidyl certain medicines for stomach problems like dicyclomine, hyoscyamine certain medicines for travel sickness like scopolamine dexamethasone dofetilide ipratropium NSAIDs, medicines for pain and inflammation, like ibuprofen or naproxen other medicines for Alzheimer's disease other medicines that prolong the QT interval (cause an abnormal heart rhythm) phenobarbital phenytoin rifampin, rifabutin or rifapentine ziprasidone This list may not describe all possible interactions. Give your health care provider a list of all the medicines, herbs, non-prescription drugs, or dietary supplements you use. Also tell them if you smoke, drink alcohol, or use illegaldrugs. Some items may interact with your medicine. What should I watch for while using this medication? Visit your doctor or health care professional for regular checks on your progress. Check with your doctor or health care professional if your symptomsdo not get better or if they get worse. You may get drowsy or dizzy. Do not drive, use machinery, or do anything thatneeds mental alertness until you know how this drug affects you. What side effects may I notice from receiving this medication? Side effects that you should report to your doctor or health care professionalas soon as possible: allergic reactions like skin rash, itching or hives, swelling of the face, lips, or tongue feeling faint or lightheaded, falls loss of bladder control seizures signs and  symptoms of a dangerous  change in heartbeat or heart rhythm like chest pain; dizziness; fast or irregular heartbeat; palpitations; feeling faint or lightheaded, falls; breathing problems signs and symptoms of infection like fever or chills; cough; sore throat; pain or trouble passing urine signs and symptoms of liver injury like dark yellow or brown urine; general ill feeling or flu-like symptoms; light-colored stools; loss of appetite; nausea; right upper belly pain; unusually weak or tired; yellowing of the eyes or skin slow heartbeat or palpitations unusual bleeding or bruising vomiting Side effects that usually do not require medical attention (report to yourdoctor or health care professional if they continue or are bothersome): diarrhea, especially when starting treatment headache loss of appetite muscle cramps nausea stomach upset This list may not describe all possible side effects. Call your doctor for medical advice about side effects. You may report side effects to FDA at1-800-FDA-1088. Where should I keep my medication? Keep out of reach of children. Store at room temperature between 15 and 30 degrees C (59 and 86 degrees F).Throw away any unused medicine after the expiration date. NOTE: This sheet is a summary. It may not cover all possible information. If you have questions about this medicine, talk to your doctor, pharmacist, orhealth care provider.  2022 Elsevier/Gold Standard (2018-03-29 10:33:41)

## 2020-12-13 ENCOUNTER — Encounter: Payer: Self-pay | Admitting: Neurology

## 2020-12-13 DIAGNOSIS — G3184 Mild cognitive impairment, so stated: Secondary | ICD-10-CM | POA: Insufficient documentation

## 2020-12-18 DIAGNOSIS — Z0000 Encounter for general adult medical examination without abnormal findings: Secondary | ICD-10-CM | POA: Diagnosis not present

## 2020-12-27 DIAGNOSIS — H26493 Other secondary cataract, bilateral: Secondary | ICD-10-CM | POA: Diagnosis not present

## 2020-12-27 DIAGNOSIS — H16223 Keratoconjunctivitis sicca, not specified as Sjogren's, bilateral: Secondary | ICD-10-CM | POA: Diagnosis not present

## 2021-01-07 DIAGNOSIS — H26492 Other secondary cataract, left eye: Secondary | ICD-10-CM | POA: Diagnosis not present

## 2021-01-14 ENCOUNTER — Telehealth: Payer: Self-pay | Admitting: Neurology

## 2021-01-14 NOTE — Telephone Encounter (Signed)
Can you call and see if patient is tolerating aricept 5mg ? If so we can inrease it to 10mg  thanks.

## 2021-01-15 MED ORDER — DONEPEZIL HCL 10 MG PO TABS: 10.0000 mg | ORAL_TABLET | Freq: Every day | ORAL | 1 refills | Status: DC

## 2021-01-15 NOTE — Telephone Encounter (Addendum)
I spoke with both the patient and his wife Ronald Blevins (on [place]).  The patient feels the Aricept is "working pretty well".  He denies any side effects. He is willing to increase to 10 mg daily at bedtime and his wife is in agreement. All questions were answered. I let her know we would send in a new prescription for 10 mg tablets so when he fills this he will only need to take 1 tablet at bedtime whereas for now he can start taking 2 of his 5 mg tablets.   Dr Jaynee Eagles aware pt reports doing well on Aricept. New Rx written for 10 mg QHS per v.o. Dr Jaynee Eagles.

## 2021-01-15 NOTE — Addendum Note (Signed)
Addended by: Gildardo Griffes on: 01/15/2021 08:33 AM   Modules accepted: Orders

## 2021-01-17 ENCOUNTER — Telehealth: Payer: Self-pay | Admitting: Neurology

## 2021-01-17 MED ORDER — DONEPEZIL HCL 5 MG PO TABS: 5.0000 mg | ORAL_TABLET | Freq: Every day | ORAL | 1 refills | Status: DC

## 2021-01-17 NOTE — Telephone Encounter (Signed)
Pt called needing to discuss his medications. Pt states that he was instructed to try doubling up on them and it did not work for him. Please advise.

## 2021-01-17 NOTE — Telephone Encounter (Signed)
Spoke with patient. For the past two nights he has taken 10 mg (two 5 mg) of Aricept. He has developed diarrhea. Discussed patient returning to 5 mg at bedtime. He is hydrating. Pt verbalized appreciation for the call.   Per v.o. Dr Jaynee Eagles, Aricept prescription changed at pharmacy back to 5 mg QHS.

## 2021-04-15 ENCOUNTER — Encounter (HOSPITAL_BASED_OUTPATIENT_CLINIC_OR_DEPARTMENT_OTHER): Payer: Self-pay | Admitting: Obstetrics and Gynecology

## 2021-04-15 ENCOUNTER — Other Ambulatory Visit: Payer: Self-pay

## 2021-04-15 ENCOUNTER — Emergency Department (HOSPITAL_BASED_OUTPATIENT_CLINIC_OR_DEPARTMENT_OTHER)
Admission: EM | Admit: 2021-04-15 | Discharge: 2021-04-15 | Disposition: A | Discharge status: Home / Self Care | Payer: Medicare Other | Attending: Emergency Medicine | Admitting: Emergency Medicine

## 2021-04-15 ENCOUNTER — Emergency Department (HOSPITAL_BASED_OUTPATIENT_CLINIC_OR_DEPARTMENT_OTHER): Payer: Medicare Other | Admitting: Radiology

## 2021-04-15 DIAGNOSIS — Z7982 Long term (current) use of aspirin: Secondary | ICD-10-CM | POA: Diagnosis not present

## 2021-04-15 DIAGNOSIS — M25551 Pain in right hip: Secondary | ICD-10-CM | POA: Diagnosis not present

## 2021-04-15 DIAGNOSIS — Z87891 Personal history of nicotine dependence: Secondary | ICD-10-CM | POA: Diagnosis not present

## 2021-04-15 DIAGNOSIS — M79604 Pain in right leg: Secondary | ICD-10-CM | POA: Diagnosis not present

## 2021-04-15 NOTE — ED Triage Notes (Signed)
Patient reports 4-5 days ago that he started having pain in the right leg that started at his back and ran down his hip into his thigh. Patient reports difficulty walking. Patient denies fall or injury.

## 2021-04-15 NOTE — Discharge Instructions (Addendum)
The x-ray today shows that you have arthritis but everything else looks normal.  You most likely have some inflammation that we will hopefully just improve the medicine.  You can take 1 Advil and 2 Tylenol together every 6 hours for the pain.

## 2021-04-15 NOTE — ED Provider Notes (Signed)
[facility] EMERGENCY DEPT Provider Note   CSN: [id number] Arrival date & time: 04/15/21  1037     History Chief Complaint  Patient presents with   Leg Pain    Ronald Blevins is a 87 y.o. male.  The history is provided by the patient and the spouse.  Leg Pain Location:  Hip Time since incident:  5 days Injury: no   Hip location:  R hip Pain details:    Quality:  Aching, shooting and throbbing   Radiates to: radiates down to the knee.   Severity:  Moderate   Onset quality:  Gradual   Duration:  5 days   Timing:  Constant   Progression:  Unchanged Chronicity:  New Prior injury to area:  No Relieved by:  NSAIDs Worsened by:  Activity, exercise and bearing weight Ineffective treatments:  None tried Associated symptoms: no back pain, no decreased ROM, no muscle weakness, no numbness, no swelling and no tingling   Risk factors comment:  No prior issues wiht hip pain     Past Medical History:  Diagnosis Date   BPH (benign prostatic hyperplasia)    per notes from Dr Lona Kettle   Chest pain    Colon polyp    Dizziness    Dyslipidemia    Gout, unspecified    per notes from Dr Lona Kettle   History of colon polyps    Hypercholesterolemia    PSA elevation    White coat hypertension 03/09/2015    Patient Active Problem List   Diagnosis Date Noted   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   Mild memory disturbance 01/24/2020   White coat hypertension 03/09/2015   TIA (transient ischemic attack) 01/08/2015   Orthostatic hypotension 01/08/2015   Hypercholesterolemia    Colon polyp    PSA elevation    Dizziness    History of colon polyps    Chest pain    Dyslipidemia     Past Surgical History:  Procedure Laterality Date   CARDIAC CATHETERIZATION     CATARACT EXTRACTION, BILATERAL Bilateral    COLONOSCOPY     TONSILLECTOMY         Family History  Problem Relation Age of Onset   Alzheimer's disease Mother     Pneumonia Mother    Dementia Father    Cancer - Colon Sister    Cancer Sister        pancreatic    Social History   Tobacco Use   Smoking status: Former    Types: Cigars   Smokeless tobacco: Never   Tobacco comments:    once in awhile a cigar (update 12/12/2020 none in over 1 yr)  Vaping Use   Vaping Use: Never used  Substance Use Topics   Alcohol use: Yes    Comment: 8 oz wine with dinner most nights   Drug use: No    Home Medications Prior to Admission medications   Medication Sig Start Date End Date Taking? Authorizing Provider  Ascorbic Acid (VITAMIN C PO) Take 500 mg by mouth daily.     [provider]  aspirin 81 MG chewable tablet Chew 1 tablet (81 mg total) by mouth daily. 12/31/14   Quintella Reichert, MD  cholecalciferol (VITAMIN D3) 25 MCG (1000 UNIT) tablet Take 1,000 Units by mouth daily.    [provider]  donepezil (ARICEPT) 5 MG tablet Take 1 tablet (5 mg total) by mouth at bedtime. 01/17/21   Melvenia Beam, MD  finasteride (PROSCAR) 5 MG tablet Take 1 tablet by mouth daily. Take 1 tab daily 01/19/15   [provider]  Misc Natural Products (BLACK CHERRY CONCENTRATE PO) Take by mouth.    [provider]  multivitamin-lutein (OCUVITE-LUTEIN) CAPS capsule Take 1 capsule by mouth daily.    [provider]  Omega-3 Fatty Acids (FISH OIL) 1200 MG CAPS Take 1,200 mg by mouth daily.    [provider]  tamsulosin (FLOMAX) 0.4 MG CAPS capsule Take 0.8 mg by mouth daily. Take 1Tab daily 01/19/15   [provider]  vitamin B-12 (CYANOCOBALAMIN) 1000 MCG tablet Take 1,000 mcg by mouth daily.    [provider]    Allergies    Patient has no known allergies.  Review of Systems   Review of Systems  Musculoskeletal:  Negative for back pain.  All other systems reviewed and are negative.  Physical Exam Updated Vital Signs BP 129/69 (BP Location: Left Arm)    Pulse (!) 52    Temp 98.6 F (37 C)     Resp 16    Ht 5\' 11"  (1.803 m)    Wt 79.4 kg    SpO2 100%    BMI 24.41 kg/m   Physical Exam Vitals and nursing note reviewed.  Constitutional:      General: He is not in acute distress.    Appearance: He is well-developed.  HENT:     Head: Normocephalic and atraumatic.  Eyes:     Conjunctiva/sclera: Conjunctivae normal.     Pupils: Pupils are equal, round, and reactive to light.  Cardiovascular:     Rate and Rhythm: Normal rate.     Heart sounds: No murmur heard. Pulmonary:     Effort: Pulmonary effort is normal. No respiratory distress.  Abdominal:     General: There is no distension.     Palpations: Abdomen is soft.     Tenderness: There is no abdominal tenderness. There is no guarding or rebound.  Musculoskeletal:        General: Tenderness present. Normal range of motion.     Right hip: Tenderness present.     Right knee: Normal.     Right lower leg: No edema.     Left lower leg: No edema.     Comments: Pain in the right hip when axially loading at the foot.  Also pain with internal and external rotation of the right hip.  No pain with flexion and extension.  Skin:    General: Skin is warm and dry.     Findings: No erythema or rash.  Neurological:     Mental Status: He is alert and oriented to person, place, and time. Mental status is at baseline.  Psychiatric:        Mood and Affect: Mood normal.        Behavior: Behavior normal.    ED Results / Procedures / Treatments   Labs (all labs ordered are listed, but only abnormal results are displayed) Labs Reviewed - No data to display  EKG None  Radiology DG Hip Unilat W or Wo Pelvis 2-3 Views Right  Result Date: 04/15/2021 CLINICAL DATA:  Right leg pain EXAM: DG HIP (WITH OR WITHOUT PELVIS) 2-3V RIGHT COMPARISON:  None. FINDINGS: Bones are osteopenic. Degenerative changes of the lumbosacral spine, SI joints and both hips. Bony pelvis and hips appear symmetric and intact. No acute osseous finding or fracture. No  diastasis. Nonobstructive bowel gas pattern. IMPRESSION: Osteopenia and degenerative changes. No  acute finding by plain radiography. Electronically Signed   By: Jerilynn Mages.  Shick M.D.   On: 04/15/2021 13:42    Procedures Procedures   Medications Ordered in ED Medications - No data to display  ED Course  I have reviewed the triage vital signs and the nursing notes.  Pertinent labs & imaging results that were available during my care of the patient were reviewed by me and considered in my medical decision making (see chart for details).    MDM Rules/Calculators/A&P                         Patient is an 87 year old male presenting today with pain in the right leg.  Seems to start in the right hip and radiate to the knee without known history of injury.  No swelling or signs concerning for DVT.  No rashes consistent with zoster.  Patient does have significant pain with axially loading at the foot and internal and external rotation.  Concern for inflammation from arthritis.  Possible bursitis.  Low suspicion for an occult fracture.  Low suspicion it is from his back as he has pretty localized pain to the hip.  Plain films are pending.  2:33 PM I have independently evaluated the x-ray and there is no sign of fracture.  Radiology remarks on osteopenia and degenerative changes without acute findings.  Patient's symptoms are most likely from arthritis.  We will given follow-up with orthopedics.     Final Clinical Impression(s) / ED Diagnoses Final diagnoses:  Right hip pain    Rx / DC Orders ED Discharge Orders     None        Blanchie Dessert, MD 04/15/21 1435

## 2021-06-19 ENCOUNTER — Ambulatory Visit: Payer: Medicare Other | Admitting: Adult Health

## 2021-06-19 ENCOUNTER — Other Ambulatory Visit: Payer: Self-pay

## 2021-06-19 ENCOUNTER — Encounter: Payer: Self-pay | Admitting: Adult Health

## 2021-06-19 VITALS — BP 124/67 | HR 74 | Ht 70.0 in | Wt 186.0 lb

## 2021-06-19 DIAGNOSIS — G3184 Mild cognitive impairment, so stated: Secondary | ICD-10-CM | POA: Diagnosis not present

## 2021-06-19 NOTE — Progress Notes (Signed)
PATIENT: Shell Yandow DOB: 12/01/1932  REASON FOR VISIT: follow up HISTORY FROM: patient PRIMARY NEUROLOGIST: Dr. Jaynee Eagles  HISTORY OF PRESENT ILLNESS:  Today 06/19/21:  Mr. Hoheisel is a 88 year old male with a history of memory disturbance.  He returns today for follow-up.  He is here today with his wife.  He denies any new issues.  He feels that his memory has remained relatively the same.  He is able to complete all ADLs independently.  He manages his own medications and appointments.  He also manages the finances.  Denies any changes in mood or behavior.  Denies any trouble sleeping.  He is currently on Aricept 5 mg at bedtime.  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 Dr. Nicole Kindred will counsel him on preventative strategies, could consider cholinesterase inhibitor as deemed appropriate.    He says he is feeling fine. He has a daughter who is helping him learn how to use GPS on a cell phone, In august he trimmed the bushes in front of the house, gardeing and active, sees his daughters one in [place] and one in [place], they drive to [place], no falls, appetite is fine, not losing weight, no accidents, daughter looks after. Wife sees that he is not remembering as much, slow gradual decline. Wife says he is a good driver we discussed a driving test and he declined, we discussed accidents. Wife would like him to think about it. Talk to daughter. Son is POA and HCPOA. Son has access to all accounts and they watch for scam artists. They had a call from someone who said he was their grandson and knew it was a scam. They do not feel they need to be re-tested. We discussed.    Personally reviewed MRI brain and agree: 01/2020: IMPRESSION: Abnormal MRI scan of the brain with and without contrast showing a benign large arachnoid cyst in the anterior middle  cranial fossa on the right.  Mild age-appropriate changes of chronic small vessel disease and generalized cerebral atrophy are noted.  No significant change compared with CT from 2016 except mild expected progression of atrophy.     HPI:  Tayari Yankee is a 88 y.o. male here as requested by Lawerance Cruel, MD for "getting lost at times". PMHx pure hypercholesterolemia, BPH, memory deficit, gout, sinusitis. I reviewed Dr. Alan Ripper notes: He does take a vitamin B complex daily, aspirin, I reviewed notes from Dr. Harrington Challenger: I not see any history of his memory changes or complaints in the notes that I received. I do see that vitamin B12 was ordered on December 12, 2019 and returned with a value of 300, all it says that patient is getting lost at times and was referred here for evaluation. I reviewed Epic and Care Everywhere and I don't see any other notes discussing any memory concerns either.    He is concerned about his memory. He has gotten lost, started about a year ago, started forgetting people's names and how to get places he has been to. He gets to places and has forgotten how to get home, he can get back and forth eventually. He is still social with family but Covid has made golfing difficult. He keeps bisy at home, fixing things around the house, he takes care of the plants, he is active. He has designed his own complicated wine  rack, no accidents with driving, no accidents at home. He manages the finances, still doing well balancing the checkbooks, not confused with dates, he misplaces his glasses "always looking for my glasses". 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 Wife is here and provides info, she says he sometimes repeats things about where they are going but he has never paid attention (they laugh), he may ask where they are going multiple times in the same day, his driving is very good and his reflexes are good. It's really mostly being able to  remember a route, he is aware with the landmarks but he forgets. [place] would take a circuitous route instead of the direct route he should know to take. Mother had Alzheimers, father had undiagnosed dementia in their 16s-80s. His brother passed away 8 years ago at the age of 52 without anymemory issues, other brother dies at 77. He snores, he kicks all night.    Reviewed notes, labs and imaging from outside physicians, which showed:   CT of the head in 2016 was completed for dizziness, personally reviewed images and agree with the following:Diffuse cerebral atrophy. Low-attenuation changes in the deep white matter consistent small vessel ischemia. Large CSF space in the right middle cranial fossa probably representing arachnoid cyst. No mass effect or midline shift. Gray-white matter junctions are distinct. Basal cisterns are not effaced. No acute intracranial hemorrhage. Visualized paranasal sinuses and mastoid air cells are not opacified. No depressed skull fractures.   IMPRESSION: CSF space in the right middle cranial fossa probably representing an arachnoid cyst. No acute intracranial abnormalities. Chronic atrophy and small vessel ischemic changes. I would also add is difficult to say with the extent of the white matter changes are on CAT scan and the cerebral atrophy does appear mildly more pronounced in the mesial temporal lobes.  REVIEW OF SYSTEMS: Out of a complete 14 system review of symptoms, the patient complains only of the following symptoms, and all other reviewed systems are negative.  ALLERGIES: No Known Allergies  HOME MEDICATIONS: Outpatient Medications Prior to Visit  Medication Sig Dispense Refill   Ascorbic Acid (VITAMIN C PO) Take 500 mg by mouth daily.      aspirin 81 MG chewable tablet Chew 1 tablet (81 mg total) by mouth daily. 30 tablet 0   cholecalciferol (VITAMIN D3) 25 MCG (1000 UNIT) tablet Take 1,000 Units by  mouth daily.     donepezil (ARICEPT) 5 MG tablet Take 1 tablet (5 mg total) by mouth at bedtime. 90 tablet 1   finasteride (PROSCAR) 5 MG tablet Take 1 tablet by mouth daily. Take 1 tab daily     Misc Natural Products (BLACK CHERRY CONCENTRATE PO) Take by mouth.     multivitamin-lutein (OCUVITE-LUTEIN) CAPS capsule Take 1 capsule by mouth daily.     Omega-3 Fatty Acids (FISH OIL) 1200 MG CAPS Take 1,200 mg by mouth daily.     tamsulosin (FLOMAX) 0.4 MG CAPS capsule Take 0.8 mg by mouth daily. Take 1Tab daily     vitamin B-12 (CYANOCOBALAMIN) 1000 MCG tablet Take 1,000 mcg by mouth daily.     No facility-administered medications prior to visit.    PAST MEDICAL HISTORY: Past Medical History:  Diagnosis Date   BPH (benign prostatic hyperplasia)    per notes from Dr Lona Kettle   Chest pain    Colon polyp    Dizziness  Dyslipidemia    Gout, unspecified    per notes from Dr Lona Kettle   History of colon polyps    Hypercholesterolemia    PSA elevation    White coat hypertension 03/09/2015    PAST SURGICAL HISTORY: Past Surgical History:  Procedure Laterality Date   CARDIAC CATHETERIZATION     CATARACT EXTRACTION, BILATERAL Bilateral    COLONOSCOPY     TONSILLECTOMY      FAMILY HISTORY: Family History  Problem Relation Age of Onset   Alzheimer's disease Mother    Pneumonia Mother    Dementia Father    Cancer - Colon Sister    Cancer Sister        pancreatic    SOCIAL HISTORY: Social History   Socioeconomic History   Marital status: Married    Spouse name: Not on file   Number of children: 3   Years of education: Not on file   Highest education level: Master's degree (e.g., MA, MS, MEng, MEd, MSW, MBA)  Occupational History   Not on file  Tobacco Use   Smoking status: Former    Types: Cigars   Smokeless tobacco: Never   Tobacco comments:    once in awhile a cigar (update 12/12/2020 none in over 1 yr)  Vaping Use   Vaping Use: Never used  Substance and  Sexual Activity   Alcohol use: Yes    Comment: 8 oz wine with dinner most nights   Drug use: No   Sexual activity: Not on file    Comment: passive smoke from cigars  Other Topics Concern   Not on file  Social History Narrative   Lives at home with wife   Right handed   Caffeine: 3 cups/day   Social Determinants of Health   Financial Resource Strain: Not on file  Food Insecurity: Not on file  Transportation Needs: Not on file  Physical Activity: Not on file  Stress: Not on file  Social Connections: Not on file  Intimate Partner Violence: Not on file      PHYSICAL EXAM  Vitals:   06/19/21 1420  Weight: 186 lb (84.4 kg)  Height: 5\' 10"  (1.778 m)   Body mass index is 26.69 kg/m.  MMSE - Mini Mental State Exam 06/19/2021 12/12/2020 01/24/2020  Orientation to time 5 4 5   Orientation to Place 3 4 2   Registration 3 3 3   Attention/ Calculation 2 3 4   Recall 3 2 3   Language- name 2 objects 2 2 2   Language- repeat 1 1 1   Language- follow 3 step command 3 3 3   Language- read & follow direction 1 1 1   Write a sentence 1 1 1   Copy design 1 0 1  Total score 25 24 26     Generalized: Well developed, in no acute distress   Neurological examination  Mentation: Alert oriented to time, place, history taking. Follows all commands speech and language fluent Cranial nerve II-XII: Pupils were equal round reactive to light. Extraocular movements were full, visual field were full on confrontational test. Facial sensation and strength were normal. Head turning and shoulder shrug  were normal and symmetric. Motor: The motor testing reveals 5 over 5 strength of all 4 extremities. Good symmetric motor tone is noted throughout.  Sensory: Sensory testing is intact to soft touch on all 4 extremities. No evidence of extinction is noted.  Coordination: Cerebellar testing reveals good finger-nose-finger and heel-to-shin bilaterally.  Gait and station: Gait is normal.  Reflexes: Deep tendon  reflexes are symmetric and normal bilaterally.   DIAGNOSTIC DATA (LABS, IMAGING, TESTING) - I reviewed patient records, labs, notes, testing and imaging myself where available.  Lab Results  Component Value Date   WBC 7.7 01/24/2020   HGB 15.3 01/24/2020   HCT 45.6 01/24/2020   MCV 93 01/24/2020   PLT 234 01/24/2020      Component Value Date/Time   NA 139 01/24/2020 1048   K 4.3 01/24/2020 1048   CL 103 01/24/2020 1048   CO2 25 01/24/2020 1048   GLUCOSE 99 01/24/2020 1048   GLUCOSE 111 (H) 12/30/2014 1826   BUN 17 01/24/2020 1048   CREATININE 0.84 01/24/2020 1048   CALCIUM 9.6 01/24/2020 1048   PROT 6.6 01/24/2020 1048   ALBUMIN 4.5 01/24/2020 1048   AST 16 01/24/2020 1048   ALT 11 01/24/2020 1048   ALKPHOS 54 01/24/2020 1048   BILITOT 0.6 01/24/2020 1048   GFRNONAA 80 01/24/2020 1048   GFRAA 92 01/24/2020 1048   Lab Results  Component Value Date   CHOL  03/30/2009    161        ATP III CLASSIFICATION:  <200     mg/dL   Desirable  200-239  mg/dL   Borderline High  >=240    mg/dL   High          HDL 70 03/30/2009   LDLCALC  03/30/2009    80        Total Cholesterol/HDL:CHD Risk Coronary Heart Disease Risk Table                     Men   Women  1/2 Average Risk   3.4   3.3  Average Risk       5.0   4.4  2 X Average Risk   9.6   7.1  3 X Average Risk  23.4   11.0        Use the calculated Patient Ratio above and the CHD Risk Table to determine the patient's CHD Risk.        ATP III CLASSIFICATION (LDL):  <100     mg/dL   Optimal  100-129  mg/dL   Near or Above                    Optimal  130-159  mg/dL   Borderline  160-189  mg/dL   High  >190     mg/dL   Very High   TRIG 56 03/30/2009   CHOLHDL 2.3 03/30/2009   No results found for: HGBA1C Lab Results  Component Value Date   VITAMINB12 562 01/24/2020       ASSESSMENT AND PLAN 88 y.o. year old male  has a past medical history of BPH (benign prostatic hyperplasia), Chest pain, Colon polyp,  Dizziness, Dyslipidemia, Gout, unspecified, History of colon polyps, Hypercholesterolemia, PSA elevation, and White coat hypertension (03/09/2015). here with:  1.  Memory disturbance  Continue on Aricept 5 mg at bedtime MMSE stable 25/30 previously 24/30 Advised if symptoms worsen or he develops new symptoms he should let us know Discussed potentially adding on Namenda in the future Follow-up in 1 year or sooner if needed     Ward Givens, MSN, NP-C 06/19/2021, 2:23 PM [facility] [address], [place], [place] [address] [phone]

## 2021-06-19 NOTE — Patient Instructions (Addendum)
Your Plan: ? ?Continue aricept 5mg  at bedtime ?If your symptoms worsen or you develop new symptoms please let us know.  ? ? ?Thank you for coming to see [place] at [facility]. I hope we have been able to provide you high quality care today. ? ?You may receive a patient satisfaction survey over the next few weeks. We would appreciate your feedback and comments so that we may continue to improve ourselves and the health of our patients. ? ?

## 2021-07-01 DIAGNOSIS — Z85828 Personal history of other malignant neoplasm of skin: Secondary | ICD-10-CM | POA: Diagnosis not present

## 2021-07-01 DIAGNOSIS — L821 Other seborrheic keratosis: Secondary | ICD-10-CM | POA: Diagnosis not present

## 2021-07-01 DIAGNOSIS — L570 Actinic keratosis: Secondary | ICD-10-CM | POA: Diagnosis not present

## 2021-07-01 DIAGNOSIS — D1801 Hemangioma of skin and subcutaneous tissue: Secondary | ICD-10-CM | POA: Diagnosis not present

## 2021-10-07 ENCOUNTER — Telehealth: Payer: Self-pay | Admitting: Adult Health

## 2021-10-07 NOTE — Telephone Encounter (Signed)
Tried calling and was not able to ring tone for pts #.  Will try later.

## 2021-10-07 NOTE — Telephone Encounter (Signed)
Pt's wife, Sandra Tellefsen (on [place]) memory loss is worsening. He can not remember one thing to the next. Pt was by hisself driving  someplace and forgot how to get back home.  Scheduled appt with Dr. Jaynee Eagles on 11/06/21 at 11:00 am

## 2021-10-08 NOTE — Telephone Encounter (Signed)
I called wife of pt.  I asked if the issues that he had were progressive worsening or acute and she stated that it was progressive.  The appt for July was ok, but if cancellation would be great.  Will place on wait list.  I relayed that acute changes may indicate underlying infections and would be checked out by pcp.  She understood.

## 2021-11-06 ENCOUNTER — Encounter: Payer: Self-pay | Admitting: Neurology

## 2021-11-06 ENCOUNTER — Ambulatory Visit: Payer: Medicare Other | Admitting: Neurology

## 2021-11-06 VITALS — BP 143/82 | HR 63 | Ht 71.0 in | Wt 183.0 lb

## 2021-11-06 DIAGNOSIS — R413 Other amnesia: Secondary | ICD-10-CM | POA: Diagnosis not present

## 2021-11-06 DIAGNOSIS — R7989 Other specified abnormal findings of blood chemistry: Secondary | ICD-10-CM | POA: Diagnosis not present

## 2021-11-06 DIAGNOSIS — G309 Alzheimer's disease, unspecified: Secondary | ICD-10-CM

## 2021-11-06 DIAGNOSIS — R4189 Other symptoms and signs involving cognitive functions and awareness: Secondary | ICD-10-CM | POA: Diagnosis not present

## 2021-11-06 DIAGNOSIS — G3184 Mild cognitive impairment, so stated: Secondary | ICD-10-CM | POA: Diagnosis not present

## 2021-11-06 DIAGNOSIS — G4730 Sleep apnea, unspecified: Secondary | ICD-10-CM | POA: Diagnosis not present

## 2021-11-06 NOTE — Progress Notes (Signed)
PATIENT: Ronald Blevins DOB: [birth year]  REASON FOR VISIT: follow up HISTORY FROM: patient PRIMARY NEUROLOGIST: Dr. Jaynee Eagles  HISTORY OF PRESENT ILLNESS:  11/06/2021; He has been more forgetful even from day to day and hour to hour. It has been more progressive since we last saw him in March and was reportedly stable. In June we received a message that he was not doing as well. Between march has not seen Dr. Harrington Challenger. Wife provides most information. She thought maybe he was drinking a [place] a week and she thought maybe that was interfering. Alcohol does affect him. Alcohol, even one, very much affects him. Still performing ADLs includes toileting, dressing, eating independently. He has lost his appetite but no significant weight loss. No falls, no head trauma, not drinking a lot of water, his urine is yellow, he urinate 3-4x a day not really bladder full. No new medications. No changes in mood. More of an increase in his memory loss, asking more of the same questions over and over and over, he appears confused he will ask and then its obvious he knows what it is but he is misinformed or has the wrong idea. He had to ask what a monastery was. Sleeping more. Falling asleep in the mornings. He cannot tolerate the apparatus sleep apnea test for his OSA and not using it and falling asleep. No aspiration. No new weakness or numbness. No other focal neurologic deficits, associated symptoms, inciting events or modifiable factors.  Patient complains of symptoms per HPI as well as the following symptoms: declined cognition . Pertinent negatives and positives per HPI. All others negative   06/19/2021: Ronald Blevins is a 88 year old male with a history of memory disturbance.  He returns today for follow-up.  He is here today with his wife.  He denies any new issues.  He feels that his memory has remained relatively the same.  He is able to complete all ADLs independently.  He manages his own medications and  appointments.  He also manages the finances.  Denies any changes in mood or behavior.  Denies any trouble sleeping.  He is currently on Aricept 5 mg at bedtime.  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 Dr. Nicole Kindred will counsel him on preventative strategies, could consider cholinesterase inhibitor as deemed appropriate.    He says he is feeling fine. He has a daughter who is helping him learn how to use GPS on a cell phone, In august he trimmed the bushes in front of the house, gardeing and active, sees his daughters one in [place] and one in [place], they drive to [place], no falls, appetite is fine, not losing weight, no accidents, daughter looks after. Wife sees that he is not remembering as much, slow gradual decline. Wife says he is a good driver we discussed a driving test and he declined, we discussed accidents. Wife would like him to think about it. Talk to daughter. Son is POA and HCPOA. Son has access to all accounts and they watch for scam artists. They had a call from someone who said he was their grandson and knew it was a scam. They do not feel they need to be re-tested. We discussed.    Personally reviewed MRI brain and agree: 01/2020: IMPRESSION: Abnormal MRI scan of the brain with and without contrast showing a benign large  arachnoid cyst in the anterior middle cranial fossa on the right.  Mild age-appropriate changes of chronic small vessel disease and generalized cerebral atrophy are noted.  No significant change compared with CT from 2016 except mild expected progression of atrophy.     HPI:  Ronald Blevins is a 88 y.o. male here as requested by Lawerance Cruel, MD for "getting lost at times". PMHx pure hypercholesterolemia, BPH, memory deficit, gout, sinusitis. I reviewed Dr. Alan Ripper notes: He does take a vitamin B complex daily, aspirin, I  reviewed notes from Dr. Harrington Challenger: I not see any history of his memory changes or complaints in the notes that I received. I do see that vitamin B12 was ordered on December 12, 2019 and returned with a value of 300, all it says that patient is getting lost at times and was referred here for evaluation. I reviewed Epic and Care Everywhere and I don't see any other notes discussing any memory concerns either.    He is concerned about his memory. He has gotten lost, started about a year ago, started forgetting people's names and how to get places he has been to. He gets to places and has forgotten how to get home, he can get back and forth eventually. He is still social with family but Covid has made golfing difficult. He keeps bisy at home, fixing things around the house, he takes care of the plants, he is active. He has designed his own complicated wine rack, no accidents with driving, no accidents at home. He manages the finances, still doing well balancing the checkbooks, not confused with dates, he misplaces his glasses "always looking for my glasses". 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 Wife is here and provides info, she says he sometimes repeats things about where they are going but he has never paid attention (they laugh), he may ask where they are going multiple times in the same day, his driving is very good and his reflexes are good. It's really mostly being able to remember a route, he is aware with the landmarks but he forgets. [place] would take a circuitous route instead of the direct route he should know to take. Mother had Alzheimers, father had undiagnosed dementia in their 8s-80s. His brother passed away 8 years ago at the age of 71 without anymemory issues, other brother dies at 27. He snores, he kicks all night.    Reviewed notes, labs and imaging from outside physicians, which showed:   CT of the  head in 2016 was completed for dizziness, personally reviewed images and agree with the following:Diffuse cerebral atrophy. Low-attenuation changes in the deep white matter consistent small vessel ischemia. Large CSF space in the right middle cranial fossa probably representing arachnoid cyst. No mass effect or midline shift. Gray-white matter junctions are distinct. Basal cisterns are not effaced. No acute intracranial hemorrhage. Visualized paranasal sinuses and mastoid air cells are not opacified. No depressed skull fractures.   IMPRESSION: CSF space in the right middle cranial fossa probably representing an arachnoid cyst. No acute intracranial abnormalities. Chronic atrophy and small vessel ischemic changes. I would also add is difficult to say with the extent of the white matter changes are on CAT scan and the cerebral atrophy does appear mildly more pronounced in the mesial temporal lobes.  REVIEW OF SYSTEMS: Out of a complete 14 system review of symptoms, the patient complains only of  the following symptoms, and all other reviewed systems are negative.  ALLERGIES: No Known Allergies  HOME MEDICATIONS: Outpatient Medications Prior to Visit  Medication Sig Dispense Refill   Ascorbic Acid (VITAMIN C PO) Take 500 mg by mouth daily.      aspirin 81 MG chewable tablet Chew 1 tablet (81 mg total) by mouth daily. 30 tablet 0   cholecalciferol (VITAMIN D3) 25 MCG (1000 UNIT) tablet Take 1,000 Units by mouth daily.     finasteride (PROSCAR) 5 MG tablet Take 1 tablet by mouth daily. Take 1 tab daily     Misc Natural Products (BLACK CHERRY CONCENTRATE PO) Take by mouth.     multivitamin-lutein (OCUVITE-LUTEIN) CAPS capsule Take 1 capsule by mouth daily.     Omega-3 Fatty Acids (FISH OIL) 1200 MG CAPS Take 1,200 mg by mouth daily.     tamsulosin (FLOMAX) 0.4 MG CAPS capsule Take 0.8 mg by mouth daily. Take 1Tab daily     vitamin B-12 (CYANOCOBALAMIN) 1000 MCG tablet Take 1,000 mcg by mouth  daily.     donepezil (ARICEPT) 5 MG tablet Take 1 tablet (5 mg total) by mouth at bedtime. 90 tablet 1   No facility-administered medications prior to visit.    PAST MEDICAL HISTORY: Past Medical History:  Diagnosis Date   BPH (benign prostatic hyperplasia)    per notes from Dr Lona Kettle   Chest pain    Colon polyp    Dizziness    Dyslipidemia    Gout, unspecified    per notes from Dr Lona Kettle   History of colon polyps    Hypercholesterolemia    PSA elevation    White coat hypertension 03/09/2015    PAST SURGICAL HISTORY: Past Surgical History:  Procedure Laterality Date   CARDIAC CATHETERIZATION     CATARACT EXTRACTION, BILATERAL Bilateral    COLONOSCOPY     TONSILLECTOMY      FAMILY HISTORY: Family History  Problem Relation Age of Onset   Alzheimer's disease Mother    Pneumonia Mother    Dementia Father    Cancer - Colon Sister    Cancer Sister        pancreatic    SOCIAL HISTORY: Social History   Socioeconomic History   Marital status: Married    Spouse name: Not on file   Number of children: 3   Years of education: Not on file   Highest education level: Master's degree (e.g., MA, MS, MEng, MEd, MSW, MBA)  Occupational History   Not on file  Tobacco Use   Smoking status: Former    Types: Cigars   Smokeless tobacco: Never   Tobacco comments:    once in awhile a cigar (update 12/12/2020 none in over 1 yr)  Vaping Use   Vaping Use: Never used  Substance and Sexual Activity   Alcohol use: Yes    Alcohol/week: 14.0 standard drinks of alcohol    Types: 14 Glasses of wine per week   Drug use: No   Sexual activity: Not on file    Comment: passive smoke from cigars  Other Topics Concern   Not on file  Social History Narrative   Lives at home with wife   Right handed   Caffeine: 3 cups/day   Social Determinants of Health   Financial Resource Strain: Not on file  Food Insecurity: Not on file  Transportation Needs: Not on file  Physical  Activity: Not on file  Stress: Not on file  Social Connections: Not  on file  Intimate Partner Violence: Not on file      PHYSICAL EXAM  Vitals:   11/06/21 1145  BP: (!) 143/82  Pulse: 63  Weight: 183 lb (83 kg)  Height: '5\' 11"'$  (1.803 m)   Body mass index is 25.52 kg/m.     11/06/2021   11:47 AM 06/19/2021    2:24 PM 12/12/2020    2:26 PM  MMSE - Mini Mental State Exam  Orientation to time '2 5 4  '$ Orientation to Place '3 3 4  '$ Registration '3 3 3  '$ Attention/ Calculation '1 2 3  '$ Recall '2 3 2  '$ Language- name 2 objects '2 2 2  '$ Language- repeat '1 1 1  '$ Language- follow 3 step command '3 3 3  '$ Language- read & follow direction '1 1 1  '$ Write a sentence '1 1 1  '$ Copy design 0 1 0  Total score '19 25 24    '$ Generalized: Well developed, in no acute distress   Neurological examination  Mentation: Alert oriented to time, place, history taking. Follows all commands speech and language fluent Cranial nerve II-XII: Pupils were equal round reactive to light. Extraocular movements were full, visual field were full on confrontational test. Facial sensation and strength were normal. Head turning and shoulder shrug  were normal and symmetric. Motor: The motor testing reveals 5 over 5 strength of all 4 extremities. Good symmetric motor tone is noted throughout.  Sensory: Sensory testing is intact to soft touch on all 4 extremities. No evidence of extinction is noted.  Coordination: Cerebellar testing reveals good finger-nose-finger and heel-to-shin bilaterally.  Gait and station: Gait is normal.  Reflexes: Deep tendon reflexes are symmetric and normal bilaterally.   DIAGNOSTIC DATA (LABS, IMAGING, TESTING) - I reviewed patient records, labs, notes, testing and imaging myself where available.  Lab Results  Component Value Date   WBC 8.3 11/06/2021   HGB 16.6 11/06/2021   HCT 48.3 11/06/2021   MCV 91 11/06/2021   PLT 246 11/06/2021      Component Value Date/Time   NA 142 11/06/2021 1347   K  4.5 11/06/2021 1347   CL 101 11/06/2021 1347   CO2 24 11/06/2021 1347   GLUCOSE 96 11/06/2021 1347   GLUCOSE 111 (H) 12/30/2014 1826   BUN 17 11/06/2021 1347   CREATININE 0.90 11/06/2021 1347   CALCIUM 9.7 11/06/2021 1347   PROT 7.1 11/06/2021 1347   ALBUMIN 4.7 11/06/2021 1347   AST 15 11/06/2021 1347   ALT 14 11/06/2021 1347   ALKPHOS 60 11/06/2021 1347   BILITOT 0.5 11/06/2021 1347   GFRNONAA 80 01/24/2020 1048   GFRAA 92 01/24/2020 1048   Lab Results  Component Value Date   CHOL  03/30/2009    161        ATP III CLASSIFICATION:  <200     mg/dL   Desirable  200-239  mg/dL   Borderline High  >=240    mg/dL   High          HDL 70 03/30/2009   LDLCALC  03/30/2009    80        Total Cholesterol/HDL:CHD Risk Coronary Heart Disease Risk Table                     Men   Women  1/2 Average Risk   3.4   3.3  Average Risk       5.0   4.4  2 X Average Risk   9.6  7.1  3 X Average Risk  23.4   11.0        Use the calculated Patient Ratio above and the CHD Risk Table to determine the patient's CHD Risk.        ATP III CLASSIFICATION (LDL):  <100     mg/dL   Optimal  100-129  mg/dL   Near or Above                    Optimal  130-159  mg/dL   Borderline  160-189  mg/dL   High  >190     mg/dL   Very High   TRIG 56 03/30/2009   CHOLHDL 2.3 03/30/2009   No results found for: "HGBA1C" Lab Results  Component Value Date   VITAMINB12 562 01/24/2020   Exam: NAD, pleasant                  Speech:    Speech is normal; fluent and spontaneous with normal comprehension.  Cognition:    The patient is oriented to person, place, and time;     recent and remote memory intact;     language fluent;    Cranial Nerves:    The pupils are equal, round, and reactive to light.Trigeminal sensation is intact and the muscles of mastication are normal. The face is symmetric. The palate elevates in the midline. Hearing intact. Voice is normal. Shoulder shrug is normal. The tongue has normal  motion without fasciculations.   Coordination:  No dysmetria  Motor Observation:    No asymmetry, no atrophy, and no involuntary movements noted. Tone:    Normal muscle tone.     Strength:    Strength is V/V in the upper and lower limbs.      Sensation: intact to LT   ASSESSMENT AND PLAN 88 y.o. year old male  has a past medical history of BPH (benign prostatic hyperplasia), Chest pain, Colon polyp, Dizziness, Dyslipidemia, Gout, unspecified, History of colon polyps, Hypercholesterolemia, PSA elevation, and White coat hypertension (03/09/2015). here with:  Blood and urine testing for metabolic//infectious causes of decline  EEG in the office and may even consider 24 hour eeg at home Sleep study result - we requested, he has moderately severe sleep apnea and needs follow up with his sleep doctor, this is a risk factor for dementia - asked team to call him Repeat formal memory testing especially in light of new medications approved. (Lecanumab)  Repeat MRI brain - structural. ask for the 3T/4T(better machine) machine with ear plugs/phone FDG PET Scan - functional test and is >90% sensitive and specific for alzheimer's disease 7.  Sleeping all the time - depression?  Or untreated sleep apnea? May be both see addendum above 8. Diarrhea on aricept '10mg'$ , continue '5mg'$  may add memantine next or ssri after workup  Meds ordered this encounter  Medications   donepezil (ARICEPT) 5 MG tablet    Sig: Take 1 tablet (5 mg total) by mouth at bedtime.    Dispense:  90 tablet    Refill:  3    Diarrhea on 10 mg prescription.   Orders Placed This Encounter  Procedures   Culture, Urine   MR BRAIN W WO CONTRAST   NM PET Metabolic Brain   Urinalysis, Routine w reflex microscopic   CBC with Differential/Platelets   Comprehensive metabolic panel   TSH   T4, Free   Ambulatory referral to Neuropsychology   EEG adult     Sarina Ill  11/10/2021, 11:46 AM [facility] [address], [place], [place] [address] [phone]  I spent 70 minutes of face-to-face and non-face-to-face time with patient on the  1. acute on chronic cognitive decline   2. Cognitive decline   3. screening Alzheimer's disease, unspecified (CODE) ([place])    diagnosis.  This included previsit chart review, lab review, study review, order entry, electronic health record documentation, patient education on the different diagnostic and therapeutic options, counseling and coordination of care, risks and benefits of management, compliance, or risk factor reduction

## 2021-11-06 NOTE — Patient Instructions (Addendum)
Blood and urine EEG in the office and may even consider 24 hour eeg at home Sleep study result Repeat formal memory testing especially in light of new medications approved. (Lecanumab)  Repeat MRI brain - structural. ask for the 3T/4T(better machine) machine with ear plugs/phone FDG PET Scan - functional test and is >90% sensitive and specific for alzheimer's disease 7.  Sleeping all the time - depression?  Or untreated sleep apnea?

## 2021-11-07 ENCOUNTER — Telehealth: Payer: Self-pay | Admitting: Neurology

## 2021-11-07 ENCOUNTER — Telehealth: Payer: Self-pay | Admitting: *Deleted

## 2021-11-07 LAB — URINALYSIS, ROUTINE W REFLEX MICROSCOPIC
Bilirubin, UA: NEGATIVE
Glucose, UA: NEGATIVE
Leukocytes,UA: NEGATIVE
Nitrite, UA: NEGATIVE
RBC, UA: NEGATIVE
Specific Gravity, UA: 1.017 (ref 1.005–1.030)
Urobilinogen, Ur: 0.2 mg/dL (ref 0.2–1.0)
pH, UA: 7 (ref 5.0–7.5)

## 2021-11-07 LAB — COMPREHENSIVE METABOLIC PANEL
ALT: 14 IU/L (ref 0–44)
AST: 15 IU/L (ref 0–40)
Albumin/Globulin Ratio: 2 (ref 1.2–2.2)
Albumin: 4.7 g/dL (ref 3.7–4.7)
Alkaline Phosphatase: 60 IU/L (ref 44–121)
BUN/Creatinine Ratio: 19 (ref 10–24)
BUN: 17 mg/dL (ref 8–27)
Bilirubin Total: 0.5 mg/dL (ref 0.0–1.2)
CO2: 24 mmol/L (ref 20–29)
Calcium: 9.7 mg/dL (ref 8.6–10.2)
Chloride: 101 mmol/L (ref 96–106)
Creatinine, Ser: 0.9 mg/dL (ref 0.76–1.27)
Globulin, Total: 2.4 g/dL (ref 1.5–4.5)
Glucose: 96 mg/dL (ref 70–99)
Potassium: 4.5 mmol/L (ref 3.5–5.2)
Sodium: 142 mmol/L (ref 134–144)
Total Protein: 7.1 g/dL (ref 6.0–8.5)
eGFR: 83 mL/min/{1.73_m2} (ref >59)

## 2021-11-07 LAB — CBC WITH DIFFERENTIAL/PLATELET
Basophils Absolute: 0.1 10*3/uL (ref 0.0–0.2)
Basos: 1 %
EOS (ABSOLUTE): 0.2 10*3/uL (ref 0.0–0.4)
Eos: 3 %
Hematocrit: 48.3 % (ref 37.5–51.0)
Hemoglobin: 16.6 g/dL (ref 13.0–17.7)
Immature Grans (Abs): 0.1 10*3/uL (ref 0.0–0.1)
Immature Granulocytes: 1 %
Lymphocytes Absolute: 2.1 10*3/uL (ref 0.7–3.1)
Lymphs: 25 %
MCH: 31.2 pg (ref 26.6–33.0)
MCHC: 34.4 g/dL (ref 31.5–35.7)
MCV: 91 fL (ref 79–97)
Monocytes Absolute: 0.7 10*3/uL (ref 0.1–0.9)
Monocytes: 8 %
Neutrophils Absolute: 5.3 10*3/uL (ref 1.4–7.0)
Neutrophils: 62 %
Platelets: 246 10*3/uL (ref 150–450)
RBC: 5.32 x10E6/uL (ref 4.14–5.80)
RDW: 12.4 % (ref 11.6–15.4)
WBC: 8.3 10*3/uL (ref 3.4–10.8)

## 2021-11-07 LAB — TSH: TSH: 2.99 u[IU]/mL (ref 0.450–4.500)

## 2021-11-07 LAB — T4, FREE: Free T4: 1.22 ng/dL (ref 0.82–1.77)

## 2021-11-07 NOTE — Telephone Encounter (Signed)
Pt's wife asked that Dr Jaynee Eagles be made aware that pt's records from Dr Patton Salles office is being sent here for her. Pt's wife is asking for a call once those records have ben received

## 2021-11-07 NOTE — Telephone Encounter (Signed)
For review

## 2021-11-07 NOTE — Telephone Encounter (Signed)
[employer] medicare NPR sent to [facility] 

## 2021-11-08 LAB — URINE CULTURE: Organism ID, Bacteria: NO GROWTH

## 2021-11-10 ENCOUNTER — Telehealth: Payer: Self-pay | Admitting: Neurology

## 2021-11-10 MED ORDER — DONEPEZIL HCL 5 MG PO TABS: 5.0000 mg | ORAL_TABLET | Freq: Every day | ORAL | 3 refills | Status: DC

## 2021-11-10 NOTE — Telephone Encounter (Signed)
We found Sleep study result - we requested, he has moderately severe sleep apnea and needs follow up with his sleep doctor, this is a risk factor for dementia(and stroke and heart attack etc).If it has been an adequate amount of time we can refer him her ebut if not e will have to see his sleep physician and address the apnea.

## 2021-11-11 ENCOUNTER — Telehealth: Payer: Self-pay | Admitting: Neurology

## 2021-11-11 NOTE — Telephone Encounter (Signed)
error 

## 2021-11-11 NOTE — Telephone Encounter (Signed)
Spoke to wife (checked DPR) gave  Dr Cathren Laine recommendations to be seen by a sleep specialist for patients sleep apnea . Wife said she will call PCP tomorrow for referral . Will forward to Dr Jaynee Eagles to see if she can give a referral for Dr. Rexene Alberts also. Informed wife per Dr.Ahern   lab work was normal  no UTI  Wife thanked me for calling

## 2021-11-11 NOTE — Telephone Encounter (Signed)
I called to relayed message below but was not able to LM as was off at the home #.

## 2021-11-12 NOTE — Telephone Encounter (Signed)
Per Dr.Ahern will let PCP  handle referral for sleep specialist .

## 2021-11-13 ENCOUNTER — Telehealth: Payer: Self-pay | Admitting: Neurology

## 2021-11-13 NOTE — Telephone Encounter (Signed)
[employer] Josem Kaufmann: [id number] exp. 11/12/21-12/27/21 sent to [facility]

## 2021-11-13 NOTE — Telephone Encounter (Signed)
Spoke to pts wife.  She wondered if pt needed to proceed with EEg and MRI.  I relayed after reviewing last ofv note that Dr. Jaynee Eagles did order these and would complete tests as ordered.  He is scheduled 11-17-21 for MRI and 11-18-2021 for EEG.  She appreciated call back.  She has an appointment with Dr. Reece Levy for OSA.  LAB results normal, no UTI.  She verbalized understanding.

## 2021-11-13 NOTE — Telephone Encounter (Signed)
Pt wife Andris Brothers) is calling and has question about Pt MRI and EEG. Dowe is requesting a call from the nurse

## 2021-11-17 ENCOUNTER — Ambulatory Visit
Admission: RE | Admit: 2021-11-17 | Discharge: 2021-11-17 | Disposition: A | Discharge status: Home / Self Care | Payer: Medicare Other | Source: Ambulatory Visit | Attending: Neurology | Admitting: Neurology

## 2021-11-17 DIAGNOSIS — R4189 Other symptoms and signs involving cognitive functions and awareness: Secondary | ICD-10-CM

## 2021-11-17 DIAGNOSIS — G309 Alzheimer's disease, unspecified: Secondary | ICD-10-CM | POA: Diagnosis not present

## 2021-11-17 MED ORDER — GADOBENATE DIMEGLUMINE 529 MG/ML IV SOLN
17.0000 mL | Freq: Once | INTRAVENOUS | Status: AC | PRN
  Administered 2021-11-17: 17 mL via INTRAVENOUS

## 2021-11-18 ENCOUNTER — Ambulatory Visit: Payer: Medicare Other | Admitting: Neurology

## 2021-11-18 DIAGNOSIS — R4182 Altered mental status, unspecified: Secondary | ICD-10-CM | POA: Diagnosis not present

## 2021-11-18 DIAGNOSIS — R4189 Other symptoms and signs involving cognitive functions and awareness: Secondary | ICD-10-CM

## 2021-11-18 NOTE — Procedures (Signed)
    History:  88 year old man with worsening memory   EEG classification: Awake and drowsy  Description of the recording: The background rhythms of this recording consists of a fairly well modulated medium amplitude alpha rhythm of 10 Hz that is reactive to eye opening and closure. As the record progresses, the patient appears to remain in the waking state throughout the recording. Photic stimulation was performed, did not show any abnormalities. Hyperventilation was not performed. Toward the end of the recording, the patient enters the drowsy state with slight symmetric slowing seen. The patient never enters stage II sleep. No abnormal epileptiform discharges seen during this recording. There was no focal slowing. EKG monitor shows a heart rate of 72, irregularly irregular rhythm.  Abnormality: None   Impression: This is a normal EEG recording in the waking and drowsy state. No evidence of interictal epileptiform discharges seen. A normal EEG does not exclude a diagnosis of epilepsy.    Alric Ran, MD [facility]

## 2021-11-19 ENCOUNTER — Telehealth: Payer: Self-pay

## 2021-11-19 DIAGNOSIS — G4733 Obstructive sleep apnea (adult) (pediatric): Secondary | ICD-10-CM | POA: Diagnosis not present

## 2021-11-19 NOTE — Telephone Encounter (Addendum)
EEG Message from Melvenia Beam, MD sent at 11/19/2021 12:18 PM EDT ----- EEG is normal, thanks  MRI Brain Melvenia Beam, MD  P Gna-Pod 4 Results No changes in MRI since 2021, this is good news. There are still the same findings of atrophy typical for age, some mild white-matter changes normal for age and a benign cyst unchanged. I would still go forward with the rest of the testing thanks

## 2021-11-19 NOTE — Telephone Encounter (Signed)
I attempted to reach the pt. Pt not available and vm not working. Will have to call back

## 2021-11-20 NOTE — Telephone Encounter (Addendum)
Spoke with wife (checked DPR) Gave wife MRI and EEG results . Wife  expressed understanding and asked if results can be mailed to  their house Will forward request to debra this afternoon Pt thanked me for calling

## 2021-11-21 NOTE — Telephone Encounter (Signed)
Done

## 2021-12-07 DIAGNOSIS — G4733 Obstructive sleep apnea (adult) (pediatric): Secondary | ICD-10-CM | POA: Diagnosis not present

## 2021-12-18 ENCOUNTER — Telehealth: Payer: Self-pay | Admitting: Neurology

## 2021-12-18 NOTE — Telephone Encounter (Signed)
..   Pt understands that although there may be some limitations with this type of visit, we will take all precautions to reduce any security or privacy concerns.  Pt understands that this will be treated like an in office visit and we will file with pt's insurance, and there may be a patient responsible charge related to this service. ? ?

## 2021-12-19 ENCOUNTER — Telehealth: Payer: Medicare Other | Admitting: Neurology

## 2021-12-19 ENCOUNTER — Telehealth: Payer: Self-pay | Admitting: Neurology

## 2021-12-19 DIAGNOSIS — G3184 Mild cognitive impairment, so stated: Secondary | ICD-10-CM

## 2021-12-19 DIAGNOSIS — G309 Alzheimer's disease, unspecified: Secondary | ICD-10-CM

## 2021-12-19 DIAGNOSIS — G4730 Sleep apnea, unspecified: Secondary | ICD-10-CM

## 2021-12-19 DIAGNOSIS — R4189 Other symptoms and signs involving cognitive functions and awareness: Secondary | ICD-10-CM

## 2021-12-19 DIAGNOSIS — R413 Other amnesia: Secondary | ICD-10-CM | POA: Diagnosis not present

## 2021-12-19 DIAGNOSIS — F09 Unspecified mental disorder due to known physiological condition: Secondary | ICD-10-CM

## 2021-12-19 NOTE — Telephone Encounter (Signed)
Please update patient's mobile number to: [phone] and schedule a follow up with me in 4-6 months for one hour.

## 2021-12-19 NOTE — Progress Notes (Addendum)
PATIENT: Ronald Blevins DOB: 1932-10-14  REASON FOR VISIT: follow up HISTORY FROM: patient PRIMARY NEUROLOGIST: Dr. Jaynee Eagles  Virtual Visit via Video Note  I connected with Ronald Blevins on 12/21/21 at 10:45 AM EDT by a video enabled telemedicine application and verified that I am speaking with the correct person using two identifiers.  Location: Patient: home Provider: office   I discussed the limitations of evaluation and management by telemedicine and the availability of in person appointments. The patient expressed understanding and agreed to proceed.  Follow Up Instructions:    I discussed the assessment and treatment plan with the patient. The patient was provided an opportunity to ask questions and all were answered. The patient agreed with the plan and demonstrated an understanding of the instructions.   The patient was advised to call back or seek an in-person evaluation if the symptoms worsen or if the condition fails to improve as anticipated.  I provided over 40 minutes of non-face-to-face time during this encounter.   Melvenia Beam, MD   HISTORY OF PRESENT ILLNESS:  12/21/2021: EEG was normal. MRI was unchanged. Patient has been called twice for FDG PET Scan and also neuropsych testing, we discussed testing to date. Wife and daughter on the line. Will provide then phone numbers to call and schedule fdg et scan and f/u testing with dr Ronald Blevins. Discussed at length and answered all questions.   IMPRESSION: This MRI of the brain with and without contrast shows the following: personally reviewed images and agree with the following: Mild generalized cortical atrophy, unchanged compared to the 01/28/2020 MRI and typical for age. Small extent of T2/FLAIR hyperintense foci in the subcortical and deep white matter of the hemispheres consistent with mild chronic microvascular ischemic change, typical for age.  This is essentially unchanged compared to the 2021 MRI. Right  middle fossa arachnoid cyst, unchanged compared to the 2021 MRI Single chronic microhemorrhage in the anterior left thalamus that was not present on the 2021 MRI.  A single microhemorrhage is nonspecific and unlikely to be symptomatic. No acute findings.  Normal enhancement pattern.  11/06/2021; He has been more forgetful even from day to day and hour to hour. It has been more progressive since we last saw him in March and was reportedly stable. In June we received a message that he was not doing as well. Between march has not seen Dr. Harrington Challenger. Wife provides most information. She thought maybe he was drinking a [place] a week and she thought maybe that was interfering. Alcohol does affect him. Alcohol, even one, very much affects him. Still performing ADLs includes toileting, dressing, eating independently. He has lost his appetite but no significant weight loss. No falls, no head trauma, not drinking a lot of water, his urine is yellow, he urinate 3-4x a day not really bladder full. No new medications. No changes in mood. More of an increase in his memory loss, asking more of the same questions over and over and over, he appears confused he will ask and then its obvious he knows what it is but he is misinformed or has the wrong idea. He had to ask what a monastery was. Sleeping more. Falling asleep in the mornings. He cannot tolerate the apparatus sleep apnea test for his OSA and not using it and falling asleep. No aspiration. No new weakness or numbness. No other focal neurologic deficits, associated symptoms, inciting events or modifiable factors.  Patient complains of symptoms per HPI as well as the following  symptoms: declined cognition . Pertinent negatives and positives per HPI. All others negative   06/19/2021: Ronald Blevins is a 88 year old male with a history of memory disturbance.  He returns today for follow-up.  He is here today with his wife.  He denies any new issues.  He feels that his memory has  remained relatively the same.  He is able to complete all ADLs independently.  He manages his own medications and appointments.  He also manages the finances.  Denies any changes in mood or behavior.  Denies any trouble sleeping.  He is currently on Aricept 5 mg at bedtime.  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 Dr. Nicole Blevins will counsel him on preventative strategies, could consider cholinesterase inhibitor as deemed appropriate.    He says he is feeling fine. He has a daughter who is helping him learn how to use GPS on a cell phone, In august he trimmed the bushes in front of the house, gardeing and active, sees his daughters one in [place] and one in [place], they drive to [place], no falls, appetite is fine, not losing weight, no accidents, daughter looks after. Wife sees that he is not remembering as much, slow gradual decline. Wife says he is a good driver we discussed a driving test and he declined, we discussed accidents. Wife would like him to think about it. Talk to daughter. Son is POA and HCPOA. Son has access to all accounts and they watch for scam artists. They had a call from someone who said he was their grandson and knew it was a scam. They do not feel they need to be re-tested. We discussed.    Personally reviewed MRI brain and agree: 01/2020: IMPRESSION: Abnormal MRI scan of the brain with and without contrast showing a benign large arachnoid cyst in the anterior middle cranial fossa on the right.  Mild age-appropriate changes of chronic small vessel disease and generalized cerebral atrophy are noted.  No significant change compared with CT from 2016 except mild expected progression of atrophy.     HPI:  Ronald Blevins is a 88 y.o. male here as requested by Lawerance Cruel, MD for "getting lost at times". PMHx pure hypercholesterolemia, BPH,  memory deficit, gout, sinusitis. I reviewed Dr. Alan Ripper notes: He does take a vitamin B complex daily, aspirin, I reviewed notes from Dr. Harrington Challenger: I not see any history of his memory changes or complaints in the notes that I received. I do see that vitamin B12 was ordered on December 12, 2019 and returned with a value of 300, all it says that patient is getting lost at times and was referred here for evaluation. I reviewed Epic and Care Everywhere and I don't see any other notes discussing any memory concerns either.    He is concerned about his memory. He has gotten lost, started about a year ago, started forgetting people's names and how to get places he has been to. He gets to places and has forgotten how to get home, he can get back and forth eventually. He is still social with family but Covid has made golfing difficult. He keeps bisy at home, fixing things around the house, he takes care of the plants, he is active. He has designed his own complicated wine rack, no accidents with driving, no accidents at home. He manages the  finances, still doing well balancing the checkbooks, not confused with dates, he misplaces his glasses "always looking for my glasses". 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 Wife is here and provides info, she says he sometimes repeats things about where they are going but he has never paid attention (they laugh), he may ask where they are going multiple times in the same day, his driving is very good and his reflexes are good. It's really mostly being able to remember a route, he is aware with the landmarks but he forgets. [place] would take a circuitous route instead of the direct route he should know to take. Mother had Alzheimers, father had undiagnosed dementia in their 4s-80s. His brother passed away 8 years ago at the age of 57 without anymemory issues, other brother dies at 52. He snores,  he kicks all night.    Reviewed notes, labs and imaging from outside physicians, which showed:   CT of the head in 2016 was completed for dizziness, personally reviewed images and agree with the following:Diffuse cerebral atrophy. Low-attenuation changes in the deep white matter consistent small vessel ischemia. Large CSF space in the right middle cranial fossa probably representing arachnoid cyst. No mass effect or midline shift. Gray-white matter junctions are distinct. Basal cisterns are not effaced. No acute intracranial hemorrhage. Visualized paranasal sinuses and mastoid air cells are not opacified. No depressed skull fractures.   IMPRESSION: CSF space in the right middle cranial fossa probably representing an arachnoid cyst. No acute intracranial abnormalities. Chronic atrophy and small vessel ischemic changes. I would also add is difficult to say with the extent of the white matter changes are on CAT scan and the cerebral atrophy does appear mildly more pronounced in the mesial temporal lobes.  REVIEW OF SYSTEMS: Out of a complete 14 system review of symptoms, the patient complains only of the following symptoms, and all other reviewed systems are negative.  ALLERGIES: No Known Allergies  HOME MEDICATIONS: Outpatient Medications Prior to Visit  Medication Sig Dispense Refill   Ascorbic Acid (VITAMIN C PO) Take 500 mg by mouth daily.      aspirin 81 MG chewable tablet Chew 1 tablet (81 mg total) by mouth daily. 30 tablet 0   cholecalciferol (VITAMIN D3) 25 MCG (1000 UNIT) tablet Take 1,000 Units by mouth daily.     donepezil (ARICEPT) 5 MG tablet Take 1 tablet (5 mg total) by mouth at bedtime. 90 tablet 3   finasteride (PROSCAR) 5 MG tablet Take 1 tablet by mouth daily. Take 1 tab daily     Misc Natural Products (BLACK CHERRY CONCENTRATE PO) Take by mouth.     multivitamin-lutein (OCUVITE-LUTEIN) CAPS capsule Take 1 capsule by mouth daily.     Omega-3 Fatty Acids (FISH OIL)  1200 MG CAPS Take 1,200 mg by mouth daily.     tamsulosin (FLOMAX) 0.4 MG CAPS capsule Take 0.8 mg by mouth daily. Take 1Tab daily     vitamin B-12 (CYANOCOBALAMIN) 1000 MCG tablet Take 1,000 mcg by mouth daily.     No facility-administered medications prior to visit.    PAST MEDICAL HISTORY: Past Medical History:  Diagnosis Date   BPH (benign prostatic hyperplasia)    per notes from Dr Lona Kettle   Chest pain    Colon polyp    Dizziness    Dyslipidemia    Gout, unspecified    per notes from  Dr Lona Kettle   History of colon polyps    Hypercholesterolemia    PSA elevation    White coat hypertension 03/09/2015    PAST SURGICAL HISTORY: Past Surgical History:  Procedure Laterality Date   CARDIAC CATHETERIZATION     CATARACT EXTRACTION, BILATERAL Bilateral    COLONOSCOPY     TONSILLECTOMY      FAMILY HISTORY: Family History  Problem Relation Age of Onset   Alzheimer's disease Mother    Pneumonia Mother    Dementia Father    Cancer - Colon Sister    Cancer Sister        pancreatic    SOCIAL HISTORY: Social History   Socioeconomic History   Marital status: Married    Spouse name: Not on file   Number of children: 3   Years of education: Not on file   Highest education level: Master's degree (e.g., MA, MS, MEng, MEd, MSW, MBA)  Occupational History   Not on file  Tobacco Use   Smoking status: Former    Types: Cigars   Smokeless tobacco: Never   Tobacco comments:    once in awhile a cigar (update 12/12/2020 none in over 1 yr)  Vaping Use   Vaping Use: Never used  Substance and Sexual Activity   Alcohol use: Yes    Alcohol/week: 14.0 standard drinks of alcohol    Types: 14 Glasses of wine per week   Drug use: No   Sexual activity: Not on file    Comment: passive smoke from cigars  Other Topics Concern   Not on file  Social History Narrative   Lives at home with wife   Right handed   Caffeine: 3 cups/day   Social Determinants of Health    Financial Resource Strain: Not on file  Food Insecurity: Not on file  Transportation Needs: Not on file  Physical Activity: Not on file  Stress: Not on file  Social Connections: Not on file  Intimate Partner Violence: Not on file    Physical exam: Exam: Gen: NAD, conversant      CV:  Denies palpitations or chest pain or SOB. VS: Breathing at a normal rate. Not febrile. Eyes: Conjunctivae clear without exudates or hemorrhage  Neuro: Detailed Neurologic Exam  Speech:    Speech is normal; fluent and spontaneous with normal comprehension.  Cognition:    The patient is oriented to person, place, and time;     recent and remote memory impaired;     language fluent;     Impaired  attention, concentration,    fund of knowledge Cranial Nerves:    The pupils are equal, round, and reactive to light.  Extraocular movements are intact.  The face is symmetric with normal sensation.Hearing intact to voice. Voice is normal. Shoulder shrug is normal. The tongue has normal motion without fasciculations.   Coordination:    Normal finger to nose  Gait:    Normal native gait  Motor Observation:   no involuntary movements noted. Tone:    Appears normal  Posture:    Posture is normal. normal erect    Strength:    Strength is anti-gravity and symmetric in the upper and lower limbs.          PHYSICAL EXAM  There were no vitals filed for this visit.  There is no height or weight on file to calculate BMI.     11/06/2021   11:47 AM 06/19/2021    2:24 PM 12/12/2020  2:26 PM  MMSE - Mini Mental State Exam  Orientation to time '2 5 4  '$ Orientation to Place '3 3 4  '$ Registration '3 3 3  '$ Attention/ Calculation '1 2 3  '$ Recall '2 3 2  '$ Language- name 2 objects '2 2 2  '$ Language- repeat '1 1 1  '$ Language- follow 3 step command '3 3 3  '$ Language- read & follow direction '1 1 1  '$ Write a sentence '1 1 1  '$ Copy design 0 1 0  Total score '19 25 24   '$ DIAGNOSTIC DATA (LABS, IMAGING, TESTING) - I  reviewed patient records, labs, notes, testing and imaging myself where available.  Lab Results  Component Value Date   WBC 8.3 11/06/2021   HGB 16.6 11/06/2021   HCT 48.3 11/06/2021   MCV 91 11/06/2021   PLT 246 11/06/2021      Component Value Date/Time   NA 142 11/06/2021 1347   K 4.5 11/06/2021 1347   CL 101 11/06/2021 1347   CO2 24 11/06/2021 1347   GLUCOSE 96 11/06/2021 1347   GLUCOSE 111 (H) 12/30/2014 1826   BUN 17 11/06/2021 1347   CREATININE 0.90 11/06/2021 1347   CALCIUM 9.7 11/06/2021 1347   PROT 7.1 11/06/2021 1347   ALBUMIN 4.7 11/06/2021 1347   AST 15 11/06/2021 1347   ALT 14 11/06/2021 1347   ALKPHOS 60 11/06/2021 1347   BILITOT 0.5 11/06/2021 1347   GFRNONAA 80 01/24/2020 1048   GFRAA 92 01/24/2020 1048   Lab Results  Component Value Date   CHOL  03/30/2009    161        ATP III CLASSIFICATION:  <200     mg/dL   Desirable  200-239  mg/dL   Borderline High  >=240    mg/dL   High          HDL 70 03/30/2009   LDLCALC  03/30/2009    80        Total Cholesterol/HDL:CHD Risk Coronary Heart Disease Risk Table                     Men   Women  1/2 Average Risk   3.4   3.3  Average Risk       5.0   4.4  2 X Average Risk   9.6   7.1  3 X Average Risk  23.4   11.0        Use the calculated Patient Ratio above and the CHD Risk Table to determine the patient's CHD Risk.        ATP III CLASSIFICATION (LDL):  <100     mg/dL   Optimal  100-129  mg/dL   Near or Above                    Optimal  130-159  mg/dL   Borderline  160-189  mg/dL   High  >190     mg/dL   Very High   TRIG 56 03/30/2009   CHOLHDL 2.3 03/30/2009   No results found for: "HGBA1C" Lab Results  Component Value Date   VITAMINB12 562 01/24/2020   ASSESSMENT AND PLAN 88 y.o. year old male  has a past medical history of BPH (benign prostatic hyperplasia), Chest pain, Colon polyp, Dizziness, Dyslipidemia, Gout, unspecified, History of colon polyps, Hypercholesterolemia, PSA elevation,  and White coat hypertension (03/09/2015). here with: declining cognition  - EEG was normal. MRI brain was unchanged. Patient has been called twice for  FDG PET Scan and also neuropsych testing, we discussed testing to date. Wife and daughter on the video a well. Will provide them phone numbers to call and schedule fdg pet scan and f/u neuropsych testing with dr Ronald Blevins.   - Sleep study result - we requested, he has moderately severe sleep apnea and needs follow up with his sleep doctor, this is a risk factor for dementia - asked team to call him and let him know, see report from 10/03/2020 Dr. Maxwell Caul at [facility] will give ot medical records to scan.    Sleeping all the time - depression?  Or untreated sleep apnea? May be both, follow up for depression and sleep apnea recommended   8. Diarrhea on aricept '10mg'$ , continue '5mg'$  may add memantine next or ssri after workup   Orders Placed This Encounter  Procedures   Ambulatory referral to Neuropsychology     Sarina Ill 12/21/2021, 12:52 PM [facility] [address], [place], [place] [address] [phone]

## 2021-12-21 ENCOUNTER — Encounter: Payer: Self-pay | Admitting: Neurology

## 2021-12-21 ENCOUNTER — Telehealth: Payer: Self-pay | Admitting: Neurology

## 2021-12-21 NOTE — Telephone Encounter (Signed)
Will you call patient's daughter and give thm the numbers to call [facility] for the fdg pet scan and the number to dr Collier Salina stewart's offce for follw up testing. They have called him but patient has dementia, please call daughter and provide info thanks

## 2021-12-24 ENCOUNTER — Telehealth: Payer: Self-pay | Admitting: Neurology

## 2021-12-24 NOTE — Telephone Encounter (Signed)
The number to the hospital to schedule is [phone]. The number for Dr Collier Salina [facility] office in [facility] is [phone].

## 2021-12-24 NOTE — Telephone Encounter (Signed)
Referral sent to Dr. Nicole Kindred [facility] [address] [place], [place] [address] [phone]

## 2021-12-24 NOTE — Telephone Encounter (Signed)
Called wife & LVM asking for call back. When she calls back, please give her the number below for formal memory testing with Dr Les Pou office. The referral was just sent to him this morning so she may want to give them a week or two to process the referral and call her before she calls his office.

## 2021-12-24 NOTE — Telephone Encounter (Signed)
Pt's wife, Yosiah Jasmin (on [place]) would like a call back to discuss have not heard from Dr. Collier Salina Stewart's office. Can leave Dr. Les Pou phone on the answering machine if we do not answer.

## 2021-12-24 NOTE — Telephone Encounter (Signed)
The referral to Dr. Nicole Kindred was just sent this morning. I sent the phone number to his office through mychart. He was scheduled for  pet scan 01/03/22 and his wife called and cancelled it this morning and did not want to reschedule.

## 2021-12-25 NOTE — Telephone Encounter (Signed)
I called and spoke to daughter, claudia stewart.  I gave her the # to Dr. Nicole Kindred as below.  They had cancelled the pet scan so did not need the number there. They are to call back if questions.

## 2021-12-26 DIAGNOSIS — M109 Gout, unspecified: Secondary | ICD-10-CM | POA: Diagnosis not present

## 2021-12-26 DIAGNOSIS — Z131 Encounter for screening for diabetes mellitus: Secondary | ICD-10-CM | POA: Diagnosis not present

## 2021-12-26 DIAGNOSIS — E7800 Pure hypercholesterolemia, unspecified: Secondary | ICD-10-CM | POA: Diagnosis not present

## 2022-01-03 ENCOUNTER — Other Ambulatory Visit (HOSPITAL_COMMUNITY): Payer: Medicare Other

## 2022-01-06 DIAGNOSIS — Z0000 Encounter for general adult medical examination without abnormal findings: Secondary | ICD-10-CM | POA: Diagnosis not present

## 2022-01-06 DIAGNOSIS — Z131 Encounter for screening for diabetes mellitus: Secondary | ICD-10-CM | POA: Diagnosis not present

## 2022-01-09 DIAGNOSIS — G4733 Obstructive sleep apnea (adult) (pediatric): Secondary | ICD-10-CM | POA: Diagnosis not present

## 2022-01-14 DIAGNOSIS — G4733 Obstructive sleep apnea (adult) (pediatric): Secondary | ICD-10-CM | POA: Diagnosis not present

## 2022-01-16 DIAGNOSIS — R599 Enlarged lymph nodes, unspecified: Secondary | ICD-10-CM | POA: Diagnosis not present

## 2022-01-24 DIAGNOSIS — R519 Headache, unspecified: Secondary | ICD-10-CM | POA: Diagnosis not present

## 2022-01-24 DIAGNOSIS — R700 Elevated erythrocyte sedimentation rate: Secondary | ICD-10-CM | POA: Diagnosis not present

## 2022-01-28 ENCOUNTER — Encounter (HOSPITAL_COMMUNITY): Payer: Self-pay | Admitting: Vascular Surgery

## 2022-01-28 ENCOUNTER — Other Ambulatory Visit: Payer: Self-pay

## 2022-01-28 ENCOUNTER — Ambulatory Visit: Payer: Medicare Other | Admitting: Vascular Surgery

## 2022-01-28 ENCOUNTER — Encounter: Payer: Self-pay | Admitting: Vascular Surgery

## 2022-01-28 DIAGNOSIS — R519 Headache, unspecified: Secondary | ICD-10-CM

## 2022-01-28 NOTE — Congregational Nurse Program (Signed)
Patient's daughter Eldridge Abrahams called regarding whether patient should take Naproxen and Prednisone tomorrow. Daughter instructed that patient should not take Naproxen tonight or in AM, but prednisone is okay to take in the morning. No further questions.

## 2022-01-28 NOTE — H&P (View-Only) (Signed)
  Patient name: Ronald Blevins MRN: [id number] DOB: 10/01/1932 Sex: male  REASON FOR CONSULT: Temporal artery biopsy  HPI: Ronald Blevins is a 89 y.o. male, with history of hyperlipidemia that presents for evaluation of temporal artery biopsy.  Started having headaches about 3 weeks ago.  These were on both sides of his scalp.  He also had some jaw pain as well that his wife states were bilateral.  No vision loss.  PCP was concerned about temporal arteritis and started him on steroids.  He had improvement in symptoms over the last several weeks.  No history of strokes or TIAs.  He is on aspirin.  ESR was sent by the PCP but I do not have the results.  Family is unaware what the results were other than elevated WBC count.  Past Medical History:  Diagnosis Date   BPH (benign prostatic hyperplasia)    per notes from Dr Charles Ross   Chest pain    Colon polyp    Dizziness    Dyslipidemia    Gout, unspecified    per notes from Dr Charles Ross   History of colon polyps    Hypercholesterolemia    PSA elevation    White coat hypertension 03/09/2015    Past Surgical History:  Procedure Laterality Date   CARDIAC CATHETERIZATION     CATARACT EXTRACTION, BILATERAL Bilateral    COLONOSCOPY     TONSILLECTOMY      Family History  Problem Relation Age of Onset   Alzheimer's disease Mother    Pneumonia Mother    Dementia Father    Cancer - Colon Sister    Cancer Sister        pancreatic    SOCIAL HISTORY: Social History   Socioeconomic History   Marital status: Married    Spouse name: Not on file   Number of children: 3   Years of education: Not on file   Highest education level: Master's degree (e.g., MA, MS, MEng, MEd, MSW, MBA)  Occupational History   Not on file  Tobacco Use   Smoking status: Former    Types: Cigars   Smokeless tobacco: Never   Tobacco comments:    once in awhile a cigar (update 12/12/2020 none in over 1 yr)  Vaping Use   Vaping Use: Never used   Substance and Sexual Activity   Alcohol use: Yes    Alcohol/week: 14.0 standard drinks of alcohol    Types: 14 Glasses of wine per week   Drug use: No   Sexual activity: Not on file    Comment: passive smoke from cigars  Other Topics Concern   Not on file  Social History Narrative   Lives at home with wife   Right handed   Caffeine: 3 cups/day   Social Determinants of Health   Financial Resource Strain: Not on file  Food Insecurity: Not on file  Transportation Needs: Not on file  Physical Activity: Not on file  Stress: Not on file  Social Connections: Not on file  Intimate Partner Violence: Not on file    No Known Allergies  Current Outpatient Medications  Medication Sig Dispense Refill   Ascorbic Acid (VITAMIN C PO) Take 500 mg by mouth daily.      aspirin 81 MG chewable tablet Chew 1 tablet (81 mg total) by mouth daily. 30 tablet 0   cholecalciferol (VITAMIN D3) 25 MCG (1000 UNIT) tablet Take 1,000 Units by mouth daily.     donepezil (ARICEPT) 5   MG tablet Take 1 tablet (5 mg total) by mouth at bedtime. 90 tablet 3   finasteride (PROSCAR) 5 MG tablet Take 1 tablet by mouth daily. Take 1 tab daily     Misc Natural Products (BLACK CHERRY CONCENTRATE PO) Take by mouth.     multivitamin-lutein (OCUVITE-LUTEIN) CAPS capsule Take 1 capsule by mouth daily.     Omega-3 Fatty Acids (FISH OIL) 1200 MG CAPS Take 1,200 mg by mouth daily.     tamsulosin (FLOMAX) 0.4 MG CAPS capsule Take 0.8 mg by mouth daily. Take 1Tab daily     vitamin B-12 (CYANOCOBALAMIN) 1000 MCG tablet Take 1,000 mcg by mouth daily.     No current facility-administered medications for this visit.    REVIEW OF SYSTEMS:  [X] denotes positive finding, [ ] denotes negative finding Cardiac  Comments:  Chest pain or chest pressure:    Shortness of breath upon exertion:    Short of breath when lying flat:    Irregular heart rhythm:        Vascular    Pain in calf, thigh, or hip brought on by ambulation:     Pain in feet at night that wakes you up from your sleep:     Blood clot in your veins:    Leg swelling:         Pulmonary    Oxygen at home:    Productive cough:     Wheezing:         Neurologic    Sudden weakness in arms or legs:     Sudden numbness in arms or legs:     Sudden onset of difficulty speaking or slurred speech:    Temporary loss of vision in one eye:     Problems with dizziness:         Gastrointestinal    Blood in stool:     Vomited blood:         Genitourinary    Burning when urinating:     Blood in urine:        Psychiatric    Major depression:         Hematologic    Bleeding problems:    Problems with blood clotting too easily:        Skin    Rashes or ulcers:        Constitutional    Fever or chills:      PHYSICAL EXAM: There were no vitals filed for this visit.  GENERAL: The patient is a well-nourished male, in no acute distress. The vital signs are documented above. CARDIAC: There is a regular rate and rhythm.  VASCULAR:  Bilateral temporal pulse palpable with no tenderness Bilateral radial pulse palpable PULMONARY: No respiratory distress. ABDOMEN: Soft and non-tender. MUSCULOSKELETAL: There are no major deformities or cyanosis. NEUROLOGIC: No focal weakness or paresthesias are detected. SKIN: There are no ulcers or rashes noted. PSYCHIATRIC: The patient has a normal affect.  DATA:   None  Assessment/Plan:  89-year-old male with dyslipidemia that presents for evaluation of temporal artery biopsy.  About 3 weeks ago he had bilateral scalp headaches with some jaw pain that improved on steroids.  I discussed the etiology of temporal arteritis and the potential risks long-term without steroid treatment including risk for vision loss as well as aneurysm formation and small risk of stroke.  Discussed that we would recommend a temporal artery biopsy at the request of Dr. Ross's his PCP to rule out temporal arteritis.  He   appears to have a  better temporal artery pulse on the right so I have recommended a right temporal artery biopsy since he has bilateral symptoms.  Risk and benefits discussed.  Will schedule for tomorrow.     Marty Heck, MD [facility] Office: [phone]

## 2022-01-28 NOTE — Progress Notes (Signed)
Spoke with Eldridge Abrahams, pt's daughter for pre-op call. She states pt does not have a cardiac history, never diagnosed with Hypertension (just has "white coat syndrome"). Pt is not diabetic.  Shower instructions given to [place] and she voiced understanding.

## 2022-01-28 NOTE — Progress Notes (Signed)
  Patient name: Ronald Blevins MRN: [id number] DOB: 12/28/1932 Sex: male  REASON FOR CONSULT: Temporal artery biopsy  HPI: Ronald Blevins is a 88 y.o. male, with history of hyperlipidemia that presents for evaluation of temporal artery biopsy.  Started having headaches about 3 weeks ago.  These were on both sides of his scalp.  He also had some jaw pain as well that his wife states were bilateral.  No vision loss.  PCP was concerned about temporal arteritis and started him on steroids.  He had improvement in symptoms over the last several weeks.  No history of strokes or TIAs.  He is on aspirin.  ESR was sent by the PCP but I do not have the results.  Family is unaware what the results were other than elevated WBC count.  Past Medical History:  Diagnosis Date   BPH (benign prostatic hyperplasia)    per notes from Dr Charles Ross   Chest pain    Colon polyp    Dizziness    Dyslipidemia    Gout, unspecified    per notes from Dr Charles Ross   History of colon polyps    Hypercholesterolemia    PSA elevation    White coat hypertension 03/09/2015    Past Surgical History:  Procedure Laterality Date   CARDIAC CATHETERIZATION     CATARACT EXTRACTION, BILATERAL Bilateral    COLONOSCOPY     TONSILLECTOMY      Family History  Problem Relation Age of Onset   Alzheimer's disease Mother    Pneumonia Mother    Dementia Father    Cancer - Colon Sister    Cancer Sister        pancreatic    SOCIAL HISTORY: Social History   Socioeconomic History   Marital status: Married    Spouse name: Not on file   Number of children: 3   Years of education: Not on file   Highest education level: Master's degree (e.g., MA, MS, MEng, MEd, MSW, MBA)  Occupational History   Not on file  Tobacco Use   Smoking status: Former    Types: Cigars   Smokeless tobacco: Never   Tobacco comments:    once in awhile a cigar (update 12/12/2020 none in over 1 yr)  Vaping Use   Vaping Use: Never used   Substance and Sexual Activity   Alcohol use: Yes    Alcohol/week: 14.0 standard drinks of alcohol    Types: 14 Glasses of wine per week   Drug use: No   Sexual activity: Not on file    Comment: passive smoke from cigars  Other Topics Concern   Not on file  Social History Narrative   Lives at home with wife   Right handed   Caffeine: 3 cups/day   Social Determinants of Health   Financial Resource Strain: Not on file  Food Insecurity: Not on file  Transportation Needs: Not on file  Physical Activity: Not on file  Stress: Not on file  Social Connections: Not on file  Intimate Partner Violence: Not on file    No Known Allergies  Current Outpatient Medications  Medication Sig Dispense Refill   Ascorbic Acid (VITAMIN C PO) Take 500 mg by mouth daily.      aspirin 81 MG chewable tablet Chew 1 tablet (81 mg total) by mouth daily. 30 tablet 0   cholecalciferol (VITAMIN D3) 25 MCG (1000 UNIT) tablet Take 1,000 Units by mouth daily.     donepezil (ARICEPT) 5   MG tablet Take 1 tablet (5 mg total) by mouth at bedtime. 90 tablet 3   finasteride (PROSCAR) 5 MG tablet Take 1 tablet by mouth daily. Take 1 tab daily     Misc Natural Products (BLACK CHERRY CONCENTRATE PO) Take by mouth.     multivitamin-lutein (OCUVITE-LUTEIN) CAPS capsule Take 1 capsule by mouth daily.     Omega-3 Fatty Acids (FISH OIL) 1200 MG CAPS Take 1,200 mg by mouth daily.     tamsulosin (FLOMAX) 0.4 MG CAPS capsule Take 0.8 mg by mouth daily. Take 1Tab daily     vitamin B-12 (CYANOCOBALAMIN) 1000 MCG tablet Take 1,000 mcg by mouth daily.     No current facility-administered medications for this visit.    REVIEW OF SYSTEMS:  [X] denotes positive finding, [ ] denotes negative finding Cardiac  Comments:  Chest pain or chest pressure:    Shortness of breath upon exertion:    Short of breath when lying flat:    Irregular heart rhythm:        Vascular    Pain in calf, thigh, or hip brought on by ambulation:     Pain in feet at night that wakes you up from your sleep:     Blood clot in your veins:    Leg swelling:         Pulmonary    Oxygen at home:    Productive cough:     Wheezing:         Neurologic    Sudden weakness in arms or legs:     Sudden numbness in arms or legs:     Sudden onset of difficulty speaking or slurred speech:    Temporary loss of vision in one eye:     Problems with dizziness:         Gastrointestinal    Blood in stool:     Vomited blood:         Genitourinary    Burning when urinating:     Blood in urine:        Psychiatric    Major depression:         Hematologic    Bleeding problems:    Problems with blood clotting too easily:        Skin    Rashes or ulcers:        Constitutional    Fever or chills:      PHYSICAL EXAM: There were no vitals filed for this visit.  GENERAL: The patient is a well-nourished male, in no acute distress. The vital signs are documented above. CARDIAC: There is a regular rate and rhythm.  VASCULAR:  Bilateral temporal pulse palpable with no tenderness Bilateral radial pulse palpable PULMONARY: No respiratory distress. ABDOMEN: Soft and non-tender. MUSCULOSKELETAL: There are no major deformities or cyanosis. NEUROLOGIC: No focal weakness or paresthesias are detected. SKIN: There are no ulcers or rashes noted. PSYCHIATRIC: The patient has a normal affect.  DATA:   None  Assessment/Plan:  88-year-old male with dyslipidemia that presents for evaluation of temporal artery biopsy.  About 3 weeks ago he had bilateral scalp headaches with some jaw pain that improved on steroids.  I discussed the etiology of temporal arteritis and the potential risks long-term without steroid treatment including risk for vision loss as well as aneurysm formation and small risk of stroke.  Discussed that we would recommend a temporal artery biopsy at the request of Dr. Ross's his PCP to rule out temporal arteritis.  He   appears to have a  better temporal artery pulse on the right so I have recommended a right temporal artery biopsy since he has bilateral symptoms.  Risk and benefits discussed.  Will schedule for tomorrow.     Marty Heck, MD [facility] Office: [phone]

## 2022-01-29 ENCOUNTER — Encounter (HOSPITAL_COMMUNITY): Payer: Self-pay | Admitting: Vascular Surgery

## 2022-01-29 ENCOUNTER — Ambulatory Visit (HOSPITAL_COMMUNITY)
Admission: RE | Admit: 2022-01-29 | Discharge: 2022-01-29 | Disposition: A | Discharge status: Home / Self Care | Payer: Medicare Other | Attending: Vascular Surgery | Admitting: Vascular Surgery

## 2022-01-29 ENCOUNTER — Encounter (HOSPITAL_COMMUNITY)
Admission: RE | Disposition: A | Discharge status: Home / Self Care | Payer: Self-pay | Source: Home / Self Care | Attending: Vascular Surgery

## 2022-01-29 ENCOUNTER — Ambulatory Visit (HOSPITAL_BASED_OUTPATIENT_CLINIC_OR_DEPARTMENT_OTHER): Payer: Medicare Other | Admitting: Certified Registered Nurse Anesthetist

## 2022-01-29 ENCOUNTER — Ambulatory Visit (HOSPITAL_COMMUNITY): Payer: Medicare Other | Admitting: Certified Registered Nurse Anesthetist

## 2022-01-29 ENCOUNTER — Other Ambulatory Visit: Payer: Self-pay

## 2022-01-29 DIAGNOSIS — Z9989 Dependence on other enabling machines and devices: Secondary | ICD-10-CM | POA: Diagnosis not present

## 2022-01-29 DIAGNOSIS — I10 Essential (primary) hypertension: Secondary | ICD-10-CM

## 2022-01-29 DIAGNOSIS — M316 Other giant cell arteritis: Secondary | ICD-10-CM | POA: Insufficient documentation

## 2022-01-29 DIAGNOSIS — G4733 Obstructive sleep apnea (adult) (pediatric): Secondary | ICD-10-CM | POA: Diagnosis not present

## 2022-01-29 DIAGNOSIS — G4730 Sleep apnea, unspecified: Secondary | ICD-10-CM | POA: Diagnosis not present

## 2022-01-29 DIAGNOSIS — Z87891 Personal history of nicotine dependence: Secondary | ICD-10-CM | POA: Insufficient documentation

## 2022-01-29 DIAGNOSIS — I773 Arterial fibromuscular dysplasia: Secondary | ICD-10-CM | POA: Diagnosis not present

## 2022-01-29 DIAGNOSIS — F17200 Nicotine dependence, unspecified, uncomplicated: Secondary | ICD-10-CM | POA: Diagnosis not present

## 2022-01-29 DIAGNOSIS — R519 Headache, unspecified: Secondary | ICD-10-CM

## 2022-01-29 DIAGNOSIS — I776 Arteritis, unspecified: Secondary | ICD-10-CM | POA: Diagnosis not present

## 2022-01-29 DIAGNOSIS — Z79899 Other long term (current) drug therapy: Secondary | ICD-10-CM | POA: Insufficient documentation

## 2022-01-29 HISTORY — DX: Orthostatic hypotension: I95.1

## 2022-01-29 HISTORY — DX: Other amnesia: R41.3

## 2022-01-29 HISTORY — PX: ARTERY BIOPSY: SHX891

## 2022-01-29 HISTORY — DX: Sleep apnea, unspecified: G47.30

## 2022-01-29 LAB — CBC
HCT: 45.7 % (ref 39.0–52.0)
Hemoglobin: 15.2 g/dL (ref 13.0–17.0)
MCH: 31.5 pg (ref 26.0–34.0)
MCHC: 33.3 g/dL (ref 30.0–36.0)
MCV: 94.6 fL (ref 80.0–100.0)
Platelets: 437 10*3/uL — ABNORMAL HIGH (ref 150–400)
RBC: 4.83 MIL/uL (ref 4.22–5.81)
RDW: 12.5 % (ref 11.5–15.5)
WBC: 14.9 10*3/uL — ABNORMAL HIGH (ref 4.0–10.5)
nRBC: 0 % (ref 0.0–0.2)

## 2022-01-29 LAB — BASIC METABOLIC PANEL
Anion gap: 7 (ref 5–15)
BUN: 29 mg/dL — ABNORMAL HIGH (ref 8–23)
CO2: 26 mmol/L (ref 22–32)
Calcium: 9.6 mg/dL (ref 8.9–10.3)
Chloride: 106 mmol/L (ref 98–111)
Creatinine, Ser: 0.94 mg/dL (ref 0.61–1.24)
GFR, Estimated: >60 mL/min (ref >60)
Glucose, Bld: 104 mg/dL — ABNORMAL HIGH (ref 70–99)
Potassium: 4.1 mmol/L (ref 3.5–5.1)
Sodium: 139 mmol/L (ref 135–145)

## 2022-01-29 SURGERY — BIOPSY TEMPORAL ARTERY
Anesthesia: Monitor Anesthesia Care | Laterality: Right

## 2022-01-29 MED ORDER — LIDOCAINE 2% (20 MG/ML) 5 ML SYRINGE
INTRAMUSCULAR | Status: DC | PRN
  Administered 2022-01-29: 40 mg via INTRAVENOUS
  Administered 2022-01-29: 60 mg via INTRAVENOUS

## 2022-01-29 MED ORDER — FENTANYL CITRATE (PF) 250 MCG/5ML IJ SOLN
INTRAMUSCULAR | Status: AC
  Filled 2022-01-29: qty 5

## 2022-01-29 MED ORDER — CHLORHEXIDINE GLUCONATE 4 % EX LIQD: 60.0000 mL | Freq: Once | CUTANEOUS | Status: DC

## 2022-01-29 MED ORDER — CHLORHEXIDINE GLUCONATE 0.12 % MT SOLN: 15.0000 mL | Freq: Once | OROMUCOSAL | Status: AC

## 2022-01-29 MED ORDER — 0.9 % SODIUM CHLORIDE (POUR BTL) OPTIME
TOPICAL | Status: DC | PRN
  Administered 2022-01-29: 1000 mL

## 2022-01-29 MED ORDER — FENTANYL CITRATE (PF) 250 MCG/5ML IJ SOLN
INTRAMUSCULAR | Status: DC | PRN
  Administered 2022-01-29: 25 ug via INTRAVENOUS

## 2022-01-29 MED ORDER — ORAL CARE MOUTH RINSE: 15.0000 mL | Freq: Once | OROMUCOSAL | Status: AC

## 2022-01-29 MED ORDER — CHLORHEXIDINE GLUCONATE 0.12 % MT SOLN
OROMUCOSAL | Status: AC
  Administered 2022-01-29: 15 mL via OROMUCOSAL
  Filled 2022-01-29: qty 15

## 2022-01-29 MED ORDER — LACTATED RINGERS IV SOLN: INTRAVENOUS | Status: DC

## 2022-01-29 MED ORDER — OXYCODONE-ACETAMINOPHEN 5-325 MG PO TABS: 1.0000 | ORAL_TABLET | Freq: Three times a day (TID) | ORAL | 0 refills | Status: DC | PRN

## 2022-01-29 MED ORDER — PROPOFOL 500 MG/50ML IV EMUL
INTRAVENOUS | Status: DC | PRN
  Administered 2022-01-29: 50 ug/kg/min via INTRAVENOUS

## 2022-01-29 MED ORDER — ONDANSETRON HCL 4 MG/2ML IJ SOLN
INTRAMUSCULAR | Status: DC | PRN
  Administered 2022-01-29: 4 mg via INTRAVENOUS

## 2022-01-29 MED ORDER — LIDOCAINE HCL (PF) 1 % IJ SOLN
INTRAMUSCULAR | Status: DC | PRN
  Administered 2022-01-29: 3.5 mL via INTRADERMAL

## 2022-01-29 MED ORDER — LIDOCAINE HCL (PF) 1 % IJ SOLN
INTRAMUSCULAR | Status: AC
  Filled 2022-01-29: qty 30

## 2022-01-29 MED ORDER — PROPOFOL 10 MG/ML IV BOLUS
INTRAVENOUS | Status: DC | PRN
  Administered 2022-01-29 (×2): 20 mg via INTRAVENOUS

## 2022-01-29 MED ORDER — GLYCOPYRROLATE PF 0.2 MG/ML IJ SOSY
PREFILLED_SYRINGE | INTRAMUSCULAR | Status: DC | PRN
  Administered 2022-01-29: .2 mg via INTRAVENOUS

## 2022-01-29 MED ORDER — CEFAZOLIN SODIUM-DEXTROSE 2-4 GM/100ML-% IV SOLN
2.0000 g | INTRAVENOUS | Status: AC
  Administered 2022-01-29: 2 g via INTRAVENOUS
  Filled 2022-01-29: qty 100

## 2022-01-29 MED ORDER — SODIUM CHLORIDE 0.9 % IV SOLN: INTRAVENOUS | Status: DC

## 2022-01-29 SURGICAL SUPPLY — 46 items
BAG COUNTER SPONGE SURGICOUNT (BAG) ×1 IMPLANT
BENZOIN TINCTURE PRP APPL 2/3 (GAUZE/BANDAGES/DRESSINGS) ×2 IMPLANT
CANISTER SUCT 3000ML PPV (MISCELLANEOUS) ×1 IMPLANT
CHLORAPREP W/TINT 26 (MISCELLANEOUS) ×1 IMPLANT
CLIP LIGATING EXTRA MED SLVR (CLIP) IMPLANT
CLIP LIGATING EXTRA SM BLUE (MISCELLANEOUS) IMPLANT
CLIP RETRACTION 3.0MM CORONARY (MISCELLANEOUS) IMPLANT
CNTNR URN SCR LID CUP LEK RST (MISCELLANEOUS) ×1 IMPLANT
CONT SPEC 4OZ STRL OR WHT (MISCELLANEOUS) ×1
COTTONBALL LRG STERILE PKG (GAUZE/BANDAGES/DRESSINGS) ×1 IMPLANT
COVER PROBE W GEL 5X96 (DRAPES) IMPLANT
COVER SURGICAL LIGHT HANDLE (MISCELLANEOUS) ×1 IMPLANT
DERMABOND ADVANCED .7 DNX12 (GAUZE/BANDAGES/DRESSINGS) IMPLANT
DRAPE OPHTHALMIC 77X100 STRL (CUSTOM PROCEDURE TRAY) ×1 IMPLANT
ELECT NDL BLADE 2-5/6 (NEEDLE) ×1 IMPLANT
ELECT NEEDLE BLADE 2-5/6 (NEEDLE) ×1 IMPLANT
ELECT REM PT RETURN 9FT ADLT (ELECTROSURGICAL) ×1
ELECTRODE REM PT RTRN 9FT ADLT (ELECTROSURGICAL) ×1 IMPLANT
GAUZE 4X4 16PLY ~~LOC~~+RFID DBL (SPONGE) ×1 IMPLANT
GEL ULTRASOUND 8.5O AQUASONIC (MISCELLANEOUS) ×1 IMPLANT
GLOVE BIO SURGEON STRL SZ8 (GLOVE) ×1 IMPLANT
GOWN STRL REUS W/ TWL LRG LVL3 (GOWN DISPOSABLE) ×1 IMPLANT
GOWN STRL REUS W/ TWL XL LVL3 (GOWN DISPOSABLE) ×1 IMPLANT
GOWN STRL REUS W/TWL LRG LVL3 (GOWN DISPOSABLE) ×1
GOWN STRL REUS W/TWL XL LVL3 (GOWN DISPOSABLE) ×1
KIT BASIN OR (CUSTOM PROCEDURE TRAY) ×1 IMPLANT
KIT TURNOVER KIT B (KITS) ×1 IMPLANT
LOOP VESSEL MINI RED (MISCELLANEOUS) ×1 IMPLANT
NDL HYPO 25GX1X1/2 BEV (NEEDLE) ×1 IMPLANT
NEEDLE HYPO 25GX1X1/2 BEV (NEEDLE) ×1 IMPLANT
NS IRRIG 1000ML POUR BTL (IV SOLUTION) ×1 IMPLANT
PACK GENERAL/GYN (CUSTOM PROCEDURE TRAY) ×1 IMPLANT
PAD ARMBOARD 7.5X6 YLW CONV (MISCELLANEOUS) ×2 IMPLANT
SPIKE FLUID TRANSFER (MISCELLANEOUS) ×1 IMPLANT
STRIP CLOSURE SKIN 1/2X4 (GAUZE/BANDAGES/DRESSINGS) ×3 IMPLANT
SUCTION FRAZIER HANDLE 10FR (MISCELLANEOUS) ×1
SUCTION TUBE FRAZIER 10FR DISP (MISCELLANEOUS) ×1 IMPLANT
SUT MNCRL AB 4-0 PS2 18 (SUTURE) ×1 IMPLANT
SUT PROLENE 6 0 BV (SUTURE) IMPLANT
SUT SILK 3 0 (SUTURE) ×1
SUT SILK 3-0 18XBRD TIE 12 (SUTURE) IMPLANT
SUT VIC AB 3-0 SH 27 (SUTURE) ×1
SUT VIC AB 3-0 SH 27X BRD (SUTURE) ×1 IMPLANT
SYR CONTROL 10ML LL (SYRINGE) ×1 IMPLANT
TOWEL GREEN STERILE (TOWEL DISPOSABLE) ×1 IMPLANT
WATER STERILE IRR 1000ML POUR (IV SOLUTION) ×1 IMPLANT

## 2022-01-29 NOTE — Op Note (Signed)
DATE OF SERVICE: 01/29/2022  PATIENT:  Ronald Blevins  88 y.o. male  PRE-OPERATIVE DIAGNOSIS:  possible temporal arteritis  POST-OPERATIVE DIAGNOSIS:  Same  PROCEDURE:   Right temporal artery biopsy  SURGEON:  Surgeon(s) and Role:    * Cherre Robins, MD - Primary  ASSISTANT: none  ANESTHESIA:   local and MAC  EBL: minimal  BLOOD ADMINISTERED:none  DRAINS: none   LOCAL MEDICATIONS USED:  LIDOCAINE   SPECIMEN:  none  COUNTS: confirmed correct.  TOURNIQUET:  none  PATIENT DISPOSITION:  PACU - hemodynamically stable.   Delay start of Pharmacological VTE agent (>24hrs) due to surgical blood loss or risk of bleeding: no  INDICATION FOR PROCEDURE: [name] is a 88 y.o. male with possible temporal arteritis. After careful discussion of risks, benefits, and alternatives the patient was offered temporal artery biopsy. The patient understood and wished to proceed.  OPERATIVE FINDINGS: identified temporal artery with duplex. Artery once isolated was interrogated with doppler. Arterial flow heard. When artery clamped, no flow heard. Specimen removed and sent to pathology.  DESCRIPTION OF PROCEDURE: After identification of the patient in the pre-operative holding area, the patient was transferred to the operating room. The patient was positioned supine on the operating room table. Anesthesia was induced. The right temple was prepped and draped in standard fashion. A surgical pause was performed confirming correct patient, procedure, and operative location.  Using intraoperative ultrasound the course of the right temporal artery was mapped on the skin.  An incision was made over the the artery and carried down through the subtenons tissue until the artery was identified and skeletonized.  A Doppler machine was used to confirm arterial flow in the structure.  The artery was clamped and Doppler flow was eliminated.  Satisfied we excised the specimen.  Clamps were applied proximally  and distally on the artery and the specimen was sharply excised.  This was passed off the table for pathologic evaluation.  Silk suture was applied to both ends of the artery.  Was achieved.  The wound was closed in layers using 3-0 Vicryl and 4-0 Monocryl.  Dermabond was applied.  Upon completion of the case instrument and sharps counts were confirmed correct. The patient was transferred to the PACU in good condition. I was present for all portions of the procedure.  Yevonne Aline. Stanford Breed, MD [facility] Phone Number: [phone] 01/29/2022 10:21 AM

## 2022-01-29 NOTE — Anesthesia Procedure Notes (Signed)
Procedure Name: MAC Date/Time: 01/29/2022 9:38 AM  Performed by: Janace Litten, CRNAPre-anesthesia Checklist: Patient identified, Emergency Drugs available, Suction available and Patient being monitored Patient Re-evaluated:Patient Re-evaluated prior to induction Oxygen Delivery Method: Simple face mask

## 2022-01-29 NOTE — Anesthesia Preprocedure Evaluation (Addendum)
Anesthesia Evaluation  Patient identified by MRN, date of birth, ID band Patient awake    Reviewed: Allergy & Precautions, H&P , NPO status , Patient's Chart, lab work & pertinent test results  Airway Mallampati: III  TM Distance: >3 FB Neck ROM: Full    Dental no notable dental hx. (+) Teeth Intact, Dental Advisory Given   Pulmonary sleep apnea and Continuous Positive Airway Pressure Ventilation , Current Smoker and Patient abstained from smoking.   Pulmonary exam normal breath sounds clear to auscultation       Cardiovascular hypertension,  Rhythm:Regular Rate:Normal     Neuro/Psych  Headaches TIA negative psych ROS   GI/Hepatic negative GI ROS, Neg liver ROS,,,  Endo/Other  negative endocrine ROS    Renal/GU negative Renal ROS  negative genitourinary   Musculoskeletal   Abdominal   Peds  Hematology negative hematology ROS (+)   Anesthesia Other Findings   Reproductive/Obstetrics negative OB ROS                             Anesthesia Physical Anesthesia Plan  ASA: 3  Anesthesia Plan: MAC   Post-op Pain Management: Ofirmev IV (intra-op)*   Induction: Intravenous  PONV Risk Score and Plan: 1 and Propofol infusion  Airway Management Planned: Natural Airway and Simple Face Mask  Additional Equipment:   Intra-op Plan:   Post-operative Plan:   Informed Consent: I have reviewed the patients History and Physical, chart, labs and discussed the procedure including the risks, benefits and alternatives for the proposed anesthesia with the patient or authorized representative who has indicated his/her understanding and acceptance.     Dental advisory given  Plan Discussed with: CRNA  Anesthesia Plan Comments:        Anesthesia Quick Evaluation

## 2022-01-29 NOTE — Anesthesia Postprocedure Evaluation (Signed)
Anesthesia Post Note  Patient: Ronald Blevins  Procedure(s) Performed: BIOPSY TEMPORAL ARTERY (Right)     Patient location during evaluation: PACU Anesthesia Type: MAC Level of consciousness: awake and alert Pain management: pain level controlled Vital Signs Assessment: post-procedure vital signs reviewed and stable Respiratory status: spontaneous breathing, nonlabored ventilation and respiratory function stable Cardiovascular status: stable and blood pressure returned to baseline Postop Assessment: no apparent nausea or vomiting Anesthetic complications: no  No notable events documented.  Last Vitals:  Vitals:   01/29/22 1022 01/29/22 1037  BP: 106/66 112/75  Pulse: 88 80  Resp: 17 10  Temp: 36.6 C 36.6 C  SpO2: 95% 96%    Last Pain:  Vitals:   01/29/22 1037  TempSrc:   PainSc: 0-No pain                 Ansel Ferrall,W. EDMOND

## 2022-01-29 NOTE — Transfer of Care (Signed)
Immediate Anesthesia Transfer of Care Note  Patient: Nile Prisk  Procedure(s) Performed: BIOPSY TEMPORAL ARTERY (Right)  Patient Location: PACU  Anesthesia Type:MAC  Level of Consciousness: drowsy, patient cooperative and responds to stimulation  Airway & Oxygen Therapy: Patient Spontanous Breathing  Post-op Assessment: Report given to RN and Post -op Vital signs reviewed and stable  Post vital signs: Reviewed and stable  Last Vitals:  Vitals Value Taken Time  BP 106/66 01/29/22 1024  Temp    Pulse 85 01/29/22 1025  Resp 15 01/29/22 1025  SpO2 96 % 01/29/22 1025  Vitals shown include unvalidated device data.  Last Pain:  Vitals:   01/29/22 0807  TempSrc:   PainSc: 0-No pain         Complications: No notable events documented.

## 2022-01-29 NOTE — Interval H&P Note (Signed)
History and Physical Interval Note:  01/29/2022 8:58 AM  Ronald Blevins  has presented today for surgery, with the diagnosis of Headache; Rule out temporal arteritis.  The various methods of treatment have been discussed with the patient and family. After consideration of risks, benefits and other options for treatment, the patient has consented to  Procedure(s): BIOPSY TEMPORAL ARTERY (Right) as a surgical intervention.  The patient's history has been reviewed, patient examined, no change in status, stable for surgery.  I have reviewed the patient's chart and labs.  Questions were answered to the patient's satisfaction.     Cherre Robins

## 2022-01-30 ENCOUNTER — Encounter (HOSPITAL_COMMUNITY): Payer: Self-pay | Admitting: Vascular Surgery

## 2022-01-30 LAB — SURGICAL PATHOLOGY

## 2022-02-03 ENCOUNTER — Other Ambulatory Visit: Payer: Self-pay | Admitting: Family Medicine

## 2022-02-03 DIAGNOSIS — M316 Other giant cell arteritis: Secondary | ICD-10-CM | POA: Diagnosis not present

## 2022-02-03 DIAGNOSIS — Z79899 Other long term (current) drug therapy: Secondary | ICD-10-CM | POA: Diagnosis not present

## 2022-03-06 ENCOUNTER — Ambulatory Visit
Admission: RE | Admit: 2022-03-06 | Discharge: 2022-03-06 | Disposition: A | Discharge status: Home / Self Care | Payer: Medicare Other | Source: Ambulatory Visit | Attending: Family Medicine | Admitting: Family Medicine

## 2022-03-06 DIAGNOSIS — M85832 Other specified disorders of bone density and structure, left forearm: Secondary | ICD-10-CM | POA: Diagnosis not present

## 2022-03-06 DIAGNOSIS — Z79899 Other long term (current) drug therapy: Secondary | ICD-10-CM

## 2022-03-07 DIAGNOSIS — M316 Other giant cell arteritis: Secondary | ICD-10-CM | POA: Diagnosis not present

## 2022-03-07 DIAGNOSIS — M542 Cervicalgia: Secondary | ICD-10-CM | POA: Diagnosis not present

## 2022-04-07 DIAGNOSIS — G301 Alzheimer's disease with late onset: Secondary | ICD-10-CM | POA: Diagnosis not present

## 2022-04-23 DIAGNOSIS — G309 Alzheimer's disease, unspecified: Secondary | ICD-10-CM | POA: Diagnosis not present

## 2022-04-23 DIAGNOSIS — M316 Other giant cell arteritis: Secondary | ICD-10-CM | POA: Diagnosis not present

## 2022-04-24 ENCOUNTER — Encounter: Payer: Self-pay | Admitting: Neurology

## 2022-04-28 ENCOUNTER — Other Ambulatory Visit: Payer: Self-pay | Admitting: Neurology

## 2022-04-28 ENCOUNTER — Telehealth: Payer: Self-pay | Admitting: Neurology

## 2022-04-28 MED ORDER — MEMANTINE HCL 5 MG PO TABS: 5.0000 mg | ORAL_TABLET | Freq: Two times a day (BID) | ORAL | 3 refills | Status: AC

## 2022-04-28 NOTE — Telephone Encounter (Addendum)
The neuropsych eval and follow up in care everywhere. Printed.    The pet scan was cancelled not aware of why.  I called wife of pt and she does not recall  (looks like it was done back in 12/2021 cancelled).  She had appt  down for 05-07-2022 to see Dr. Jaynee Eagles.  This was cancelled by Dr. Jaynee Eagles due to having appt in March 09-2022 with MM/NP.

## 2022-04-28 NOTE — Telephone Encounter (Signed)
Daughter emailed about being present at next appointment, apparently I have a follow up appointment in February but it doesn't appear patient has followed any of my instructions from seeing him 4 months ago. Has he gone to formal neuropsychitric testing with dr Nicole Kindred? Can we can dr stewart's office and see if he has responded?   Patient has been called twice for FDG PET Scan and also neuropsych testing Wife and daughter on the video last time as well. We actually provided them phone numbers to call and schedule fdg pet scan and f/u neuropsych testing with dr Nicole Kindred. Can you ask if the daughter did that?  Unfortunately I cannot help if he will not follow my instructions I need this testing before I see him can we get an update and discuss with daughter please?

## 2022-04-28 NOTE — Telephone Encounter (Signed)
Pt wife is calling. Stated she wants to talk to nurse about putting pt on some medication. She is requesting a call back from the nurse.

## 2022-04-28 NOTE — Telephone Encounter (Signed)
See phone note

## 2022-04-28 NOTE — Telephone Encounter (Signed)
Please make sure they have had a follow up appointment with Dr. Nicole Kindred to review the testing. After he gives them the report, they meet and he discusses his findings and send him to me for treatment. So ensure the entire family has reviewed the report and results with Dr. Nicole Kindred before our appointment. Thanks will send in namenda

## 2022-04-28 NOTE — Telephone Encounter (Signed)
Spoke with wife (checked DPR)  Wife wanted patient to start on namenda . Will forward Dr Jaynee Eagles the request . Wife wanted sooner appointment to discuss formal memory testing results Made appointment for 05/2022 . Wife was appreciative of sooner appointment.  Per Dr Jaynee Eagles will discuss further testing for patient at appointment in Feb 2024. Wife thanked me for calling

## 2022-04-29 NOTE — Telephone Encounter (Signed)
The feedback visit between Dr Nicole Kindred and patient/family was on 04/18/22.

## 2022-05-07 ENCOUNTER — Ambulatory Visit: Payer: Medicare Other | Admitting: Neurology

## 2022-05-16 DIAGNOSIS — G309 Alzheimer's disease, unspecified: Secondary | ICD-10-CM | POA: Diagnosis not present

## 2022-05-16 DIAGNOSIS — M316 Other giant cell arteritis: Secondary | ICD-10-CM | POA: Diagnosis not present

## 2022-05-16 DIAGNOSIS — R531 Weakness: Secondary | ICD-10-CM | POA: Diagnosis not present

## 2022-06-09 DIAGNOSIS — R310 Gross hematuria: Secondary | ICD-10-CM | POA: Diagnosis not present

## 2022-06-10 ENCOUNTER — Telehealth: Payer: Self-pay | Admitting: Neurology

## 2022-06-10 ENCOUNTER — Encounter: Payer: Self-pay | Admitting: Neurology

## 2022-06-10 ENCOUNTER — Ambulatory Visit: Payer: Medicare Other | Admitting: Neurology

## 2022-06-10 VITALS — BP 112/58 | HR 83 | Ht 72.0 in | Wt 182.0 lb

## 2022-06-10 DIAGNOSIS — F0280 Dementia in other diseases classified elsewhere without behavioral disturbance: Secondary | ICD-10-CM | POA: Diagnosis not present

## 2022-06-10 DIAGNOSIS — G309 Alzheimer's disease, unspecified: Secondary | ICD-10-CM | POA: Diagnosis not present

## 2022-06-10 NOTE — Telephone Encounter (Signed)
Please call Dr. Harrington Challenger' office and get an updated medication list to reconcile he apparently changed his namenda.

## 2022-06-10 NOTE — Progress Notes (Signed)
PATIENT: Ronald Blevins DOB: 09/10/1932  HISTORY OF PRESENT ILLNESS:  06/10/2022: - Formal memory testing c/w alzheimer's disease. Dr. Hilma Favors namenda bu they do not know what dosage, will call him. Could not tolerate higher dose of aricept stay on 5ng. Discussed in detail and answered all questions. Gave literature and resources.  Discussed testing with Dr. Nicole Kindred who spent extended time discussing dementia syndromes.  His diagnosis is Alzheimer's disease this is a neurodegenerative disorder.  His wife and daughter attended the follow-up session.  Late mild to early moderate stage dementia and he counseled him at length about the assistance that is required at this point in the disease including the need for 24-hour supervision, I also discussed this with him and discussed ways of managing behavioral and psychological symptoms of dementia including his apathy.  Namenda was suggested and I agree.  Patient complains of symptoms per HPI as well as the following symptoms: alzheimer . Pertinent negatives and positives per HPI. All others negative   12/21/2021: EEG was normal. MRI was unchanged. Patient has been called twice for FDG PET Scan and also neuropsych testing, we discussed testing to date. Wife and daughter on the line. Will provide then phone numbers to call and schedule fdg et scan and f/u testing with dr Nicole Kindred. Discussed at length and answered all questions.   IMPRESSION: This MRI of the brain with and without contrast shows the following: personally reviewed images and agree with the following: Mild generalized cortical atrophy, unchanged compared to the 01/28/2020 MRI and typical for age. Small extent of T2/FLAIR hyperintense foci in the subcortical and deep white matter of the hemispheres consistent with mild chronic microvascular ischemic change, typical for age.  This is essentially unchanged compared to the 2021 MRI. Right middle fossa arachnoid cyst, unchanged compared to the  2021 MRI Single chronic microhemorrhage in the anterior left thalamus that was not present on the 2021 MRI.  A single microhemorrhage is nonspecific and unlikely to be symptomatic. No acute findings.  Normal enhancement pattern.  11/06/2021; He has been more forgetful even from day to day and hour to hour. It has been more progressive since we last saw him in March and was reportedly stable. In June we received a message that he was not doing as well. Between march has not seen Dr. Harrington Challenger. Wife provides most information. She thought maybe he was drinking a [place] a week and she thought maybe that was interfering. Alcohol does affect him. Alcohol, even one, very much affects him. Still performing ADLs includes toileting, dressing, eating independently. He has lost his appetite but no significant weight loss. No falls, no head trauma, not drinking a lot of water, his urine is yellow, he urinate 3-4x a day not really bladder full. No new medications. No changes in mood. More of an increase in his memory loss, asking more of the same questions over and over and over, he appears confused he will ask and then its obvious he knows what it is but he is misinformed or has the wrong idea. He had to ask what a monastery was. Sleeping more. Falling asleep in the mornings. He cannot tolerate the apparatus sleep apnea test for his OSA and not using it and falling asleep. No aspiration. No new weakness or numbness. No other focal neurologic deficits, associated symptoms, inciting events or modifiable factors.  Patient complains of symptoms per HPI as well as the following symptoms: declined cognition . Pertinent negatives and positives per HPI. All  others negative   06/19/2021: Mr. Hot is a 89 year old male with a history of memory disturbance.  He returns today for follow-up.  He is here today with his wife.  He denies any new issues.  He feels that his memory has remained relatively the same.  He is able to complete  all ADLs independently.  He manages his own medications and appointments.  He also manages the finances.  Denies any changes in mood or behavior.  Denies any trouble sleeping.  He is currently on Aricept 5 mg at bedtime.  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 Dr. Nicole Kindred will counsel him on preventative strategies, could consider cholinesterase inhibitor as deemed appropriate.    He says he is feeling fine. He has a daughter who is helping him learn how to use GPS on a cell phone, In august he trimmed the bushes in front of the house, gardeing and active, sees his daughters one in [place] and one in [place], they drive to [place], no falls, appetite is fine, not losing weight, no accidents, daughter looks after. Wife sees that he is not remembering as much, slow gradual decline. Wife says he is a good driver we discussed a driving test and he declined, we discussed accidents. Wife would like him to think about it. Talk to daughter. Son is POA and HCPOA. Son has access to all accounts and they watch for scam artists. They had a call from someone who said he was their grandson and knew it was a scam. They do not feel they need to be re-tested. We discussed.    Personally reviewed MRI brain and agree: 01/2020: IMPRESSION: Abnormal MRI scan of the brain with and without contrast showing a benign large arachnoid cyst in the anterior middle cranial fossa on the right.  Mild age-appropriate changes of chronic small vessel disease and generalized cerebral atrophy are noted.  No significant change compared with CT from 2016 except mild expected progression of atrophy.     HPI:  Ronald Blevins is a 89 y.o. male here as requested by Lawerance Cruel, MD for "getting lost at times". PMHx pure hypercholesterolemia, BPH, memory deficit, gout, sinusitis. I reviewed Dr.  Alan Ripper notes: He does take a vitamin B complex daily, aspirin, I reviewed notes from Dr. Harrington Challenger: I not see any history of his memory changes or complaints in the notes that I received. I do see that vitamin B12 was ordered on December 12, 2019 and returned with a value of 300, all it says that patient is getting lost at times and was referred here for evaluation. I reviewed Epic and Care Everywhere and I don't see any other notes discussing any memory concerns either.    He is concerned about his memory. He has gotten lost, started about a year ago, started forgetting people's names and how to get places he has been to. He gets to places and has forgotten how to get home, he can get back and forth eventually. He is still social with family but Covid has made golfing difficult. He keeps bisy at home, fixing things around the house, he takes care of the plants, he is active. He has designed his own complicated wine rack, no accidents with driving, no accidents at home. He manages the finances, still doing well balancing the checkbooks, not confused with dates,  he misplaces his glasses "always looking for my glasses". 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 Wife is here and provides info, she says he sometimes repeats things about where they are going but he has never paid attention (they laugh), he may ask where they are going multiple times in the same day, his driving is very good and his reflexes are good. It's really mostly being able to remember a route, he is aware with the landmarks but he forgets. [place] would take a circuitous route instead of the direct route he should know to take. Mother had Alzheimers, father had undiagnosed dementia in their 64s-80s. His brother passed away 8 years ago at the age of 30 without anymemory issues, other brother dies at 100. He snores, he kicks all night.    Reviewed notes, labs  and imaging from outside physicians, which showed:   CT of the head in 2016 was completed for dizziness, personally reviewed images and agree with the following:Diffuse cerebral atrophy. Low-attenuation changes in the deep white matter consistent small vessel ischemia. Large CSF space in the right middle cranial fossa probably representing arachnoid cyst. No mass effect or midline shift. Gray-white matter junctions are distinct. Basal cisterns are not effaced. No acute intracranial hemorrhage. Visualized paranasal sinuses and mastoid air cells are not opacified. No depressed skull fractures.   IMPRESSION: CSF space in the right middle cranial fossa probably representing an arachnoid cyst. No acute intracranial abnormalities. Chronic atrophy and small vessel ischemic changes. I would also add is difficult to say with the extent of the white matter changes are on CAT scan and the cerebral atrophy does appear mildly more pronounced in the mesial temporal lobes.  REVIEW OF SYSTEMS: Out of a complete 14 system review of symptoms, the patient complains only of the following symptoms, and all other reviewed systems are negative.  ALLERGIES: No Known Allergies  HOME MEDICATIONS: Outpatient Medications Prior to Visit  Medication Sig Dispense Refill   cholecalciferol (VITAMIN D3) 25 MCG (1000 UNIT) tablet Take 1,000 Units by mouth daily.     donepezil (ARICEPT) 5 MG tablet Take 1 tablet (5 mg total) by mouth at bedtime. 90 tablet 3   memantine (NAMENDA) 5 MG tablet Take 1 tablet (5 mg total) by mouth 2 (two) times daily. 180 tablet 3   Misc Natural Products (BLACK CHERRY CONCENTRATE PO) Take 1 tablet by mouth as needed.     multivitamin-lutein (OCUVITE-LUTEIN) CAPS capsule Take 1 capsule by mouth daily.     Omega-3 Fatty Acids (FISH OIL) 1200 MG CAPS Take 1,200 mg by mouth daily.     predniSONE (DELTASONE) 10 MG tablet Take 10 mg by mouth daily with breakfast.     tamsulosin (FLOMAX) 0.4 MG CAPS  capsule Take 0.8 mg by mouth daily.     vitamin B-12 (CYANOCOBALAMIN) 1000 MCG tablet Take 1,000 mcg by mouth daily.     Ascorbic Acid (VITAMIN C PO) Take 1,000 mg by mouth daily.     aspirin 81 MG chewable tablet Chew 1 tablet (81 mg total) by mouth daily. 30 tablet 0   finasteride (PROSCAR) 5 MG tablet Take 5 mg by mouth daily.     naproxen (NAPROSYN) 500 MG tablet Take 500 mg by mouth 2 (two) times daily with a meal.     oxyCODONE-acetaminophen (PERCOCET) 5-325 MG tablet Take 1 tablet by mouth every 8 (eight) hours as needed  for severe pain. 6 tablet 0   No facility-administered medications prior to visit.    PAST MEDICAL HISTORY: Past Medical History:  Diagnosis Date   BPH (benign prostatic hyperplasia)    per notes from Dr Lona Kettle   Chest pain    Colon polyp    Dizziness    Dyslipidemia    Gout, unspecified    per notes from Dr Lona Kettle   History of colon polyps    Hypercholesterolemia    Memory difficulties    due to age   Orthostatic hypotension    PSA elevation    Sleep apnea    uses a cpap   White coat hypertension 03/09/2015    PAST SURGICAL HISTORY: Past Surgical History:  Procedure Laterality Date   ARTERY BIOPSY Right 01/29/2022   Procedure: BIOPSY TEMPORAL ARTERY;  Surgeon: Cherre Robins, MD;  [facility]: [facility];  Service: Vascular;  Laterality: Right;   CARDIAC CATHETERIZATION     CATARACT EXTRACTION, BILATERAL Bilateral    COLONOSCOPY     TONSILLECTOMY      FAMILY HISTORY: Family History  Problem Relation Age of Onset   Alzheimer's disease Mother    Pneumonia Mother    Dementia Father    Cancer - Colon Sister    Cancer Sister        pancreatic    SOCIAL HISTORY: Social History   Socioeconomic History   Marital status: Married    Spouse name: Not on file   Number of children: 3   Years of education: Not on file   Highest education level: Master's degree (e.g., MA, MS, MEng, MEd, MSW, MBA)  Occupational History   Not on file   Tobacco Use   Smoking status: Some Days    Types: Cigars   Smokeless tobacco: Never   Tobacco comments:    once in awhile a cigar (update 12/12/2020 none in over 1 yr)  Vaping Use   Vaping Use: Never used  Substance and Sexual Activity   Alcohol use: Yes    Alcohol/week: 21.0 standard drinks of alcohol    Types: 21 Glasses of wine per week   Drug use: No   Sexual activity: Not on file    Comment: passive smoke from cigars  Other Topics Concern   Not on file  Social History Narrative   Lives at home with wife   Right handed   Caffeine: 3 cups/day   Social Determinants of Health   Financial Resource Strain: Not on file  Food Insecurity: Not on file  Transportation Needs: Not on file  Physical Activity: Not on file  Stress: Not on file  Social Connections: Not on file  Intimate Partner Violence: Not on file  Exam: NAD, pleasant                  Speech:    Speech is normal; fluent and spontaneous with normal comprehension.  Cognition:    11/06/2021   11:47 AM 06/19/2021    2:24 PM 12/12/2020    2:26 PM  MMSE - Mini Mental State Exam  Orientation to time 2 5 4  $ Orientation to Place 3 3 4  $ Registration 3 3 3  $ Attention/ Calculation 1 2 3  $ Recall 2 3 2  $ Language- name 2 objects 2 2 2  $ Language- repeat 1 1 1  $ Language- follow 3 step command 3 3 3  $ Language- read & follow direction 1 1 1  $ Write a sentence 1 1 1  Copy design 0 1 0  Total score 19 25 24      $ Cranial Nerves:    The pupils are equal, round, and reactive to light.Trigeminal sensation is intact and the muscles of mastication are normal. The face is symmetric. The palate elevates in the midline. Hearing intact. Voice is normal. Shoulder shrug is normal. The tongue has normal motion without fasciculations.   Coordination:  No dysmetria  Motor Observation:    No asymmetry, no atrophy, and no involuntary movements noted. Tone:    Normal muscle tone.     Strength:    Strength is symmetrical in the upper  and lower limbs.      Sensation: intact to LT  Gait: stooped          PHYSICAL EXAM  Vitals:   06/10/22 1354  BP: (!) 112/58  Pulse: 83  Weight: 182 lb (82.6 kg)  Height: 6' (1.829 m)    Body mass index is 24.68 kg/m.     11/06/2021   11:47 AM 06/19/2021    2:24 PM 12/12/2020    2:26 PM  MMSE - Mini Mental State Exam  Orientation to time 2 5 4  $ Orientation to Place 3 3 4  $ Registration 3 3 3  $ Attention/ Calculation 1 2 3  $ Recall 2 3 2  $ Language- name 2 objects 2 2 2  $ Language- repeat 1 1 1  $ Language- follow 3 step command 3 3 3  $ Language- read & follow direction 1 1 1  $ Write a sentence 1 1 1  $ Copy design 0 1 0  Total score 19 25 24   $ DIAGNOSTIC DATA (LABS, IMAGING, TESTING) - I reviewed patient records, labs, notes, testing and imaging myself where available.  Lab Results  Component Value Date   WBC 14.9 (H) 01/29/2022   HGB 15.2 01/29/2022   HCT 45.7 01/29/2022   MCV 94.6 01/29/2022   PLT 437 (H) 01/29/2022      Component Value Date/Time   NA 139 01/29/2022 0820   NA 142 11/06/2021 1347   K 4.1 01/29/2022 0820   CL 106 01/29/2022 0820   CO2 26 01/29/2022 0820   GLUCOSE 104 (H) 01/29/2022 0820   BUN 29 (H) 01/29/2022 0820   BUN 17 11/06/2021 1347   CREATININE 0.94 01/29/2022 0820   CALCIUM 9.6 01/29/2022 0820   PROT 7.1 11/06/2021 1347   ALBUMIN 4.7 11/06/2021 1347   AST 15 11/06/2021 1347   ALT 14 11/06/2021 1347   ALKPHOS 60 11/06/2021 1347   BILITOT 0.5 11/06/2021 1347   GFRNONAA >60 01/29/2022 0820   GFRAA 92 01/24/2020 1048   Lab Results  Component Value Date   CHOL  03/30/2009    161        ATP III CLASSIFICATION:  <200     mg/dL   Desirable  200-239  mg/dL   Borderline High  >=240    mg/dL   High          HDL 70 03/30/2009   LDLCALC  03/30/2009    80        Total Cholesterol/HDL:CHD Risk Coronary Heart Disease Risk Table                     Men   Women  1/2 Average Risk   3.4   3.3  Average Risk       5.0   4.4  2 X Average  Risk   9.6   7.1  3  X Average Risk  23.4   11.0        Use the calculated Patient Ratio above and the CHD Risk Table to determine the patient's CHD Risk.        ATP III CLASSIFICATION (LDL):  <100     mg/dL   Optimal  100-129  mg/dL   Near or Above                    Optimal  130-159  mg/dL   Borderline  160-189  mg/dL   High  >190     mg/dL   Very High   TRIG 56 03/30/2009   CHOLHDL 2.3 03/30/2009   No results found for: "HGBA1C" Lab Results  Component Value Date   VITAMINB12 562 01/24/2020   ASSESSMENT AND PLAN 89 y.o. year old male  has a past medical history of BPH (benign prostatic hyperplasia), Chest pain, Colon polyp, Dizziness, Dyslipidemia, Gout, unspecified, History of colon polyps, Hypercholesterolemia, Memory difficulties, Orthostatic hypotension, PSA elevation, Sleep apnea, and White coat hypertension (03/09/2015). here with: declining cognition  - 06/10/2022: - Formal memory testing c/w alzheimer's disease. Dr. Hilma Favors namenda but they do not know what dosage, will call him, recommend 9m BID or 235mER. Could not tolerate higher dose of aricept stay on 5ng. Discussed in detail and answered all questions. Gave literature and resources.daughter and wife provide most information.  Discussed testing with Dr. StNicole Kindredho spent extended time discussing dementia syndromes.  His diagnosis is Alzheimer's disease this is a neurodegenerative disorder.  His wife and daughter attended the follow-up session.  Late mild to early moderate stage dementia and he counseled him at length about the assistance that is required at this point in the disease including the need for 24-hour supervision, I also discussed this with him and discussed ways of managing behavioral and psychological symptoms of dementia including his apathy.  Namenda was suggested and I agree. Gave literature and resources.offered genetic testing, looking for markers in the blood, FDG PET Scan  - EEG was normal. MRI  brain was unchanged.   - Sleep study result - we requested, he has moderately severe sleep apnea and needs follow up with his sleep doctor, this is a risk factor for dementia - asked team to call him and let him know, see report from 10/03/2020 Dr. [facility]well [facility]ault [facility] will give ot medical records to scan.    Sleeping all the time - depression?  Or untreated sleep apnea? May be both, follow up for depression and sleep apnea recommended   8. Diarrhea on aricept 107mcontinue 5mg27mntinur  memantine.    I spent over 30 minutes of face-to-face and non-face-to-face time with patient on the  1. Alzheimer's disease (HCC)[place] diagnosis.  This included previsit chart review, lab review, study review, order entry, electronic health record documentation, patient education on the different diagnostic and therapeutic options, counseling and coordination of care, risks and benefits of management, compliance, or risk factor reduction    AntoSarina Ill0/2024, 3:00 PM [facility] [id number]White [address] [phone]

## 2022-06-10 NOTE — Telephone Encounter (Signed)
Will call tomorrow as office closed.

## 2022-06-10 NOTE — Patient Instructions (Addendum)
Keep aricept/donepezil Continue the Namenda/Memantine Discussed Lecanemab https://www.dailymail.co.uk/health/dementia/article-13100625/dementia-expert-expertise-practice-diagnosed-Alzheimers.html  Lecanemab Injection What is this medication? LECANEMAB (lek AN e mab) treats Alzheimer disease. It works by decreasing the buildup of amyloid, a protein that may cause Alzheimer disease. This may slow down the worsening of symptoms. It is a monoclonal antibody. This medicine may be used for other purposes; ask your health care provider or pharmacist if you have questions. COMMON BRAND NAME(S): LEQEMBI What should I tell my care team before I take this medication? They need to know if you have any of these conditions: An unusual or allergic reaction to lecanemab, other medications, foods, dyes, or preservatives Take medications that treat or prevent blood clots Pregnant or trying to get pregnant Breast-feeding How should I use this medication? This medication is injected into a vein. It is given by your care team in a hospital or clinic setting. A special MedGuide will be given to you before each treatment. Be sure to read this information carefully each time. Talk to your care team about the use of this medication in children. Special care may be needed. Overdosage: If you think you have taken too much of this medicine contact a poison control center or emergency room at once. NOTE: This medicine is only for you. Do not share this medicine with others. What if I miss a dose? Keep appointments for follow-up doses. It is important not to miss your dose. Call your care team if you are unable to keep an appointment. What may interact with this medication? Interactions have not been studied. This list may not describe all possible interactions. Give your health care provider a list of all the medicines, herbs, non-prescription drugs, or dietary supplements you use. Also tell them if you smoke, drink  alcohol, or use illegal drugs. Some items may interact with your medicine. What should I watch for while using this medication? Visit your care team for regular checks on your progress. Tell your care team if your symptoms do not start to get better or if they get worse. What side effects may I notice from receiving this medication? Side effects that you should report to your care team as soon as possible: Allergic reactions or angioedema--skin rash, itching or hives, swelling of the face, eyes, lips, tongue, arms, or legs, trouble swallowing or breathing Headache, worsening confusion, dizziness, change in vision, nausea, seizures Infusion reactions--chest pain, shortness of breath or trouble breathing, feeling faint or lightheaded Side effects that usually do not require medical attention (report these to your care team if they continue or are bothersome): Cough Diarrhea Headache This list may not describe all possible side effects. Call your doctor for medical advice about side effects. You may report side effects to FDA at 1-800-FDA-1088. Where should I keep my medication? This medication is given in a hospital or clinic. It will not be stored at home. NOTE: This sheet is a summary. It may not cover all possible information. If you have questions about this medicine, talk to your doctor, pharmacist, or health care provider.  2023 Elsevier/Gold Standard (2021-05-07 00:00:00)

## 2022-06-11 NOTE — Telephone Encounter (Signed)
Called Haralson @ [facility] and requested fax with pt's most recent medication list. They are sending it to [phone] now.

## 2022-06-11 NOTE — Addendum Note (Signed)
Addended by: Gildardo Griffes on: 06/11/2022 10:44 AM   Modules accepted: Orders

## 2022-06-11 NOTE — Telephone Encounter (Signed)
We received the most recent medication list from [facility]. They have patient's Memantine at 5 mg QHS. Medication list in [facility] chart has been updated.

## 2022-06-12 NOTE — Telephone Encounter (Signed)
Please call Dr Harrington Challenger nurse at [facility] and ask if they made any changs to his memantine/namenda lately and why. Patient and family had no idea. Maybe he wasn't doing well on the memantine and they decreased it. thanks

## 2022-06-12 NOTE — Telephone Encounter (Signed)
Attempted to call, will call Monday.

## 2022-06-16 NOTE — Telephone Encounter (Signed)
I called spoke to [facility], she will send message to nurse.  They are to call back.. pt ordered memantine '5mg'$  po bid.  04-28-2022.

## 2022-06-17 DIAGNOSIS — R319 Hematuria, unspecified: Secondary | ICD-10-CM | POA: Diagnosis not present

## 2022-06-17 DIAGNOSIS — R8290 Unspecified abnormal findings in urine: Secondary | ICD-10-CM | POA: Diagnosis not present

## 2022-06-17 NOTE — Telephone Encounter (Signed)
Yes increase to BID please thanks

## 2022-06-17 NOTE — Telephone Encounter (Addendum)
Barbara called back from [facility].  They did not change dose for memantine.  It was told to them by pt that was taking once daily memantine '5mg'$ .  I called [facility] and they prescribed as taking '5mg'$  po BID.

## 2022-06-25 ENCOUNTER — Ambulatory Visit: Payer: Medicare Other | Admitting: Adult Health

## 2022-07-09 NOTE — Telephone Encounter (Signed)
Message was not read by patient/family. I have called the daughter Eldridge Abrahams (on [place]) and LVM asking for call back. When she calls back, please confirm with her that patient is taking Memantine 5 mg twice daily. [facility] had it listed as once daily but it was not prescribed that way. Dr Jaynee Eagles had wanted patient to increase to 5 mg twice daily as long as he is tolerating it well so we just wanted to make sure the family knew. The pharmacy has the prescription listed as 5 mg twice daily.

## 2022-07-09 NOTE — Telephone Encounter (Signed)
Pt daughter called. Stated she would like to speak with nurse.

## 2022-07-09 NOTE — Telephone Encounter (Signed)
I spoke with the patient's daughter, Eldridge Abrahams. She stated she did recall that the patient had side effects to the memantine twice a day dosing and [facility] had decreased it to once daily.  She believes the side effect may have been diarrhea or possibly constipation.  He has been on the once daily dosing since then. She asked if the patient should go back to twice daily dosing and if there's great  benefit.  She states the patient still intermittently will have a loose stool but they do not know what is causing it.

## 2022-07-10 NOTE — Telephone Encounter (Signed)
Pt daughter returned call from nurse. She is requesting a call back.

## 2022-07-10 NOTE — Telephone Encounter (Signed)
Sure, please increase to twice a day but if any significant diarrhea please stop this can lead to dehydration and sepsis and hospitalization for bad diarrhea thanks

## 2022-07-10 NOTE — Telephone Encounter (Signed)
Called Colette and LVM with office number asking for call back.

## 2022-07-10 NOTE — Telephone Encounter (Signed)
Spoke with patient and discussed recommendation from Dr Jaynee Eagles below regarding increasing the Memantine. Ronald Blevins verbalized understanding and said she would discuss this with the pt and his wife. If the patient develops significant diarrhea they will call right away for instructions. She verbalized appreciation for the call.

## 2022-07-24 DIAGNOSIS — G309 Alzheimer's disease, unspecified: Secondary | ICD-10-CM | POA: Diagnosis not present

## 2022-07-24 DIAGNOSIS — Z79899 Other long term (current) drug therapy: Secondary | ICD-10-CM | POA: Diagnosis not present

## 2022-07-24 DIAGNOSIS — R040 Epistaxis: Secondary | ICD-10-CM | POA: Diagnosis not present

## 2022-07-24 DIAGNOSIS — M316 Other giant cell arteritis: Secondary | ICD-10-CM | POA: Diagnosis not present

## 2022-08-13 DIAGNOSIS — Z85828 Personal history of other malignant neoplasm of skin: Secondary | ICD-10-CM | POA: Diagnosis not present

## 2022-08-13 DIAGNOSIS — L905 Scar conditions and fibrosis of skin: Secondary | ICD-10-CM | POA: Diagnosis not present

## 2022-08-13 DIAGNOSIS — D1801 Hemangioma of skin and subcutaneous tissue: Secondary | ICD-10-CM | POA: Diagnosis not present

## 2022-08-13 DIAGNOSIS — L821 Other seborrheic keratosis: Secondary | ICD-10-CM | POA: Diagnosis not present

## 2022-08-13 DIAGNOSIS — L570 Actinic keratosis: Secondary | ICD-10-CM | POA: Diagnosis not present

## 2022-08-13 DIAGNOSIS — D225 Melanocytic nevi of trunk: Secondary | ICD-10-CM | POA: Diagnosis not present

## 2022-10-31 DIAGNOSIS — M316 Other giant cell arteritis: Secondary | ICD-10-CM | POA: Diagnosis not present

## 2022-11-24 ENCOUNTER — Other Ambulatory Visit: Payer: Self-pay | Admitting: Neurology

## 2023-01-14 DIAGNOSIS — Z79899 Other long term (current) drug therapy: Secondary | ICD-10-CM | POA: Diagnosis not present

## 2023-01-14 DIAGNOSIS — M109 Gout, unspecified: Secondary | ICD-10-CM | POA: Diagnosis not present

## 2023-01-14 DIAGNOSIS — E7800 Pure hypercholesterolemia, unspecified: Secondary | ICD-10-CM | POA: Diagnosis not present

## 2023-01-14 DIAGNOSIS — M316 Other giant cell arteritis: Secondary | ICD-10-CM | POA: Diagnosis not present

## 2023-01-21 DIAGNOSIS — Z0000 Encounter for general adult medical examination without abnormal findings: Secondary | ICD-10-CM | POA: Diagnosis not present

## 2023-01-21 DIAGNOSIS — E7800 Pure hypercholesterolemia, unspecified: Secondary | ICD-10-CM | POA: Diagnosis not present

## 2023-01-21 DIAGNOSIS — Z9181 History of falling: Secondary | ICD-10-CM | POA: Diagnosis not present

## 2023-01-21 DIAGNOSIS — Z23 Encounter for immunization: Secondary | ICD-10-CM | POA: Diagnosis not present

## 2023-01-21 DIAGNOSIS — M316 Other giant cell arteritis: Secondary | ICD-10-CM | POA: Diagnosis not present

## 2023-01-21 DIAGNOSIS — G309 Alzheimer's disease, unspecified: Secondary | ICD-10-CM | POA: Diagnosis not present

## 2023-04-27 ENCOUNTER — Other Ambulatory Visit: Payer: Self-pay | Admitting: Anesthesiology

## 2023-04-27 MED ORDER — DONEPEZIL HCL 5 MG PO TABS: 5.0000 mg | ORAL_TABLET | Freq: Every day | ORAL | 0 refills | Status: DC

## 2023-08-08 ENCOUNTER — Other Ambulatory Visit: Payer: Self-pay | Admitting: Neurology
